# Patient Record
Sex: Female | Born: 1980 | Race: White | Hispanic: No | Marital: Single | Smoking: Never smoker
Health system: Southern US, Community
[De-identification: ages and names within clinical notes are randomized; demographics above are authoritative.]

## PROBLEM LIST (undated history)

## (undated) DIAGNOSIS — Z973 Presence of spectacles and contact lenses: Secondary | ICD-10-CM

## (undated) DIAGNOSIS — G4700 Insomnia, unspecified: Secondary | ICD-10-CM

## (undated) DIAGNOSIS — M1990 Unspecified osteoarthritis, unspecified site: Secondary | ICD-10-CM

## (undated) DIAGNOSIS — E039 Hypothyroidism, unspecified: Secondary | ICD-10-CM

## (undated) DIAGNOSIS — J45990 Exercise induced bronchospasm: Secondary | ICD-10-CM

## (undated) DIAGNOSIS — F319 Bipolar disorder, unspecified: Secondary | ICD-10-CM

## (undated) DIAGNOSIS — K219 Gastro-esophageal reflux disease without esophagitis: Secondary | ICD-10-CM

## (undated) DIAGNOSIS — F603 Borderline personality disorder: Secondary | ICD-10-CM

## (undated) DIAGNOSIS — R0683 Snoring: Secondary | ICD-10-CM

## (undated) DIAGNOSIS — J3089 Other allergic rhinitis: Secondary | ICD-10-CM

## (undated) DIAGNOSIS — I10 Essential (primary) hypertension: Secondary | ICD-10-CM

## (undated) DIAGNOSIS — N200 Calculus of kidney: Secondary | ICD-10-CM

## (undated) DIAGNOSIS — E221 Hyperprolactinemia: Secondary | ICD-10-CM

## (undated) DIAGNOSIS — F32A Depression, unspecified: Secondary | ICD-10-CM

## (undated) DIAGNOSIS — R109 Unspecified abdominal pain: Secondary | ICD-10-CM

## (undated) DIAGNOSIS — K449 Diaphragmatic hernia without obstruction or gangrene: Secondary | ICD-10-CM

## (undated) DIAGNOSIS — Z87442 Personal history of urinary calculi: Secondary | ICD-10-CM

## (undated) DIAGNOSIS — R350 Frequency of micturition: Secondary | ICD-10-CM

## (undated) DIAGNOSIS — F329 Major depressive disorder, single episode, unspecified: Secondary | ICD-10-CM

## (undated) DIAGNOSIS — E785 Hyperlipidemia, unspecified: Secondary | ICD-10-CM

## (undated) DIAGNOSIS — N201 Calculus of ureter: Secondary | ICD-10-CM

## (undated) DIAGNOSIS — R3915 Urgency of urination: Secondary | ICD-10-CM

## (undated) DIAGNOSIS — M797 Fibromyalgia: Secondary | ICD-10-CM

## (undated) DIAGNOSIS — Z8719 Personal history of other diseases of the digestive system: Secondary | ICD-10-CM

## (undated) DIAGNOSIS — G43909 Migraine, unspecified, not intractable, without status migrainosus: Secondary | ICD-10-CM

## (undated) DIAGNOSIS — K589 Irritable bowel syndrome without diarrhea: Secondary | ICD-10-CM

## (undated) DIAGNOSIS — R000 Tachycardia, unspecified: Secondary | ICD-10-CM

---
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
## 1999-05-26 ENCOUNTER — Encounter: Payer: Self-pay | Admitting: Emergency Medicine

## 1999-05-26 ENCOUNTER — Emergency Department (HOSPITAL_COMMUNITY): Admission: EM | Admit: 1999-05-26 | Discharge: 1999-05-26 | Payer: Self-pay | Admitting: Emergency Medicine

## 1999-06-29 ENCOUNTER — Other Ambulatory Visit: Admission: RE | Admit: 1999-06-29 | Discharge: 1999-06-29 | Payer: Self-pay | Admitting: *Deleted

## 2003-01-28 ENCOUNTER — Emergency Department (HOSPITAL_COMMUNITY): Admission: EM | Admit: 2003-01-28 | Discharge: 2003-01-28 | Payer: Self-pay | Admitting: Emergency Medicine

## 2005-10-02 HISTORY — PX: OTHER SURGICAL HISTORY: SHX169

## 2007-10-03 HISTORY — PX: BREAST BIOPSY: SHX20

## 2013-02-02 ENCOUNTER — Emergency Department (HOSPITAL_COMMUNITY)
Admission: EM | Admit: 2013-02-02 | Discharge: 2013-02-03 | Disposition: A | Discharge status: Home / Self Care | Payer: BC Managed Care – PPO | Attending: Emergency Medicine | Admitting: Emergency Medicine

## 2013-02-02 ENCOUNTER — Emergency Department (HOSPITAL_COMMUNITY): Payer: BC Managed Care – PPO

## 2013-02-02 ENCOUNTER — Encounter (HOSPITAL_COMMUNITY): Payer: Self-pay | Admitting: *Deleted

## 2013-02-02 DIAGNOSIS — K8050 Calculus of bile duct without cholangitis or cholecystitis without obstruction: Secondary | ICD-10-CM

## 2013-02-02 DIAGNOSIS — Z87442 Personal history of urinary calculi: Secondary | ICD-10-CM | POA: Insufficient documentation

## 2013-02-02 DIAGNOSIS — F319 Bipolar disorder, unspecified: Secondary | ICD-10-CM | POA: Insufficient documentation

## 2013-02-02 DIAGNOSIS — G4700 Insomnia, unspecified: Secondary | ICD-10-CM | POA: Insufficient documentation

## 2013-02-02 DIAGNOSIS — Z79899 Other long term (current) drug therapy: Secondary | ICD-10-CM | POA: Insufficient documentation

## 2013-02-02 DIAGNOSIS — K219 Gastro-esophageal reflux disease without esophagitis: Secondary | ICD-10-CM | POA: Insufficient documentation

## 2013-02-02 DIAGNOSIS — K8020 Calculus of gallbladder without cholecystitis without obstruction: Secondary | ICD-10-CM | POA: Insufficient documentation

## 2013-02-02 DIAGNOSIS — Z3202 Encounter for pregnancy test, result negative: Secondary | ICD-10-CM | POA: Insufficient documentation

## 2013-02-02 DIAGNOSIS — F603 Borderline personality disorder: Secondary | ICD-10-CM | POA: Insufficient documentation

## 2013-02-02 DIAGNOSIS — IMO0001 Reserved for inherently not codable concepts without codable children: Secondary | ICD-10-CM | POA: Insufficient documentation

## 2013-02-02 DIAGNOSIS — R110 Nausea: Secondary | ICD-10-CM | POA: Insufficient documentation

## 2013-02-02 DIAGNOSIS — I10 Essential (primary) hypertension: Secondary | ICD-10-CM | POA: Insufficient documentation

## 2013-02-02 HISTORY — DX: Insomnia, unspecified: G47.00

## 2013-02-02 HISTORY — DX: Depression, unspecified: F32.A

## 2013-02-02 HISTORY — DX: Gastro-esophageal reflux disease without esophagitis: K21.9

## 2013-02-02 HISTORY — DX: Major depressive disorder, single episode, unspecified: F32.9

## 2013-02-02 HISTORY — DX: Essential (primary) hypertension: I10

## 2013-02-02 HISTORY — DX: Borderline personality disorder: F60.3

## 2013-02-02 HISTORY — DX: Bipolar disorder, unspecified: F31.9

## 2013-02-02 HISTORY — DX: Fibromyalgia: M79.7

## 2013-02-02 LAB — URINALYSIS, MICROSCOPIC ONLY
Bilirubin Urine: NEGATIVE
Glucose, UA: NEGATIVE mg/dL
Hgb urine dipstick: NEGATIVE
Specific Gravity, Urine: 1.031 — ABNORMAL HIGH (ref 1.005–1.030)
Urobilinogen, UA: 1 mg/dL (ref 0.0–1.0)
pH: 6 (ref 5.0–8.0)

## 2013-02-02 LAB — COMPREHENSIVE METABOLIC PANEL
ALT: 28 U/L (ref 0–35)
AST: 23 U/L (ref 0–37)
Albumin: 3.8 g/dL (ref 3.5–5.2)
CO2: 27 mEq/L (ref 19–32)
Calcium: 9.5 mg/dL (ref 8.4–10.5)
Chloride: 104 mEq/L (ref 96–112)
GFR calc non Af Amer: >90 mL/min (ref >90)
Sodium: 138 mEq/L (ref 135–145)

## 2013-02-02 LAB — CBC WITH DIFFERENTIAL/PLATELET
Basophils Absolute: 0 10*3/uL (ref 0.0–0.1)
Basophils Relative: 1 % (ref 0–1)
Eosinophils Relative: 1 % (ref 0–5)
Lymphocytes Relative: 25 % (ref 12–46)
Neutro Abs: 5.5 10*3/uL (ref 1.7–7.7)
Platelets: 375 10*3/uL (ref 150–400)
RDW: 12.1 % (ref 11.5–15.5)
WBC: 8.3 10*3/uL (ref 4.0–10.5)

## 2013-02-02 LAB — LIPASE, BLOOD: Lipase: 31 U/L (ref 11–59)

## 2013-02-02 MED ORDER — ONDANSETRON HCL 4 MG PO TABS
4.0000 mg | ORAL_TABLET | Freq: Once | ORAL | Status: AC
  Administered 2013-02-02: 4 mg via ORAL
  Filled 2013-02-02: qty 1

## 2013-02-02 MED ORDER — HYDROCODONE-ACETAMINOPHEN 5-325 MG PO TABS
1.0000 | ORAL_TABLET | Freq: Once | ORAL | Status: AC
  Administered 2013-02-02: 1 via ORAL
  Filled 2013-02-02: qty 1

## 2013-02-02 NOTE — ED Notes (Signed)
Pt states that she has been having abd pain, gas, constipation and nausea off and on for 6 months; pt also c/o reflux and heartburn; pt states that the symptoms have gotten worse over the last 3 weeks; pt states that she is not vomiting but c/o nausea after eating anything; pt states that the abd pain is mostly epigastric pain but has some lower abd pain off and on.

## 2013-02-02 NOTE — ED Notes (Signed)
U/S at pt bedside 

## 2013-02-02 NOTE — ED Provider Notes (Signed)
History     CSN: [id number]  Arrival date & time 02/02/13  4098   First MD Initiated Contact with Patient 02/02/13 2139      Chief Complaint  Patient presents with  . Abdominal Pain  . Nausea    (Consider location/radiation/quality/duration/timing/severity/associated sxs/prior treatment) HPI   Patient is a 32 yo female past history significant for GERD and fibromyalgia presented to the emergency department for worsening abdominal pain over last 6 months, specifically the last 2 weeks. Pain is located in the epigastric or right upper quadrant sharp constant with associated nausea without vomiting. Patient has also had associated on and off constipation and diarrhea, also states she's noticed in stool color changes such as green stools. Denies any recent antibiotic use, abdominal or pelvic surgeries, travel, or dietary changes.    Past Medical History  Diagnosis Date  . Fibromyalgia   . Bipolar 1 disorder   . Depression   . Insomnia   . Borderline personality disorder   . Hypertension   . GERD (gastroesophageal reflux disease)   . Renal disorder     kidney stones with stent    History reviewed. No pertinent past surgical history.  No family history on file.  History  Substance Use Topics  . Smoking status: Never Smoker   . Smokeless tobacco: Not on file  . Alcohol Use: Yes     Comment: socially    OB History   Grav Para Term Preterm Abortions TAB SAB Ect Mult Living                  Review of Systems  Constitutional: Positive for chills.  Eyes: Negative for visual disturbance.  Respiratory: Negative for cough and shortness of breath.   Cardiovascular: Negative for chest pain.  Gastrointestinal: Positive for nausea, abdominal pain, diarrhea and constipation. Negative for vomiting, blood in stool and anal bleeding.  Genitourinary: Positive for frequency.  Musculoskeletal: Positive for myalgias.       Consistent w/ fibromyalgia pain w/o change  Skin: Negative.    Neurological: Positive for headaches.    Allergies  Morphine and related  Home Medications   Current Outpatient Rx  Name  Route  Sig  Dispense  Refill  . DULoxetine (CYMBALTA) 30 MG capsule   Oral   Take 30 mg by mouth 3 (three) times daily.         Marland Kitchen lamoTRIgine (LAMICTAL) 100 MG tablet   Oral   Take 100 mg by mouth at bedtime.         . lidocaine (LIDODERM) 5 %   Transdermal   Place 1 patch onto the skin daily. Remove & Discard patch within 12 hours or as directed by MD         . Melatonin 10 MG TABS   Oral   Take 1 tablet by mouth at bedtime.         . methocarbamol (ROBAXIN) 500 MG tablet   Oral   Take 500 mg by mouth 3 (three) times daily.         Marland Kitchen omeprazole (PRILOSEC OTC) 20 MG tablet   Oral   Take 20 mg by mouth every evening.         . pregabalin (LYRICA) 100 MG capsule   Oral   Take 100 mg by mouth 3 (three) times daily.         Marland Kitchen zolpidem (AMBIEN) 10 MG tablet   Oral   Take 10 mg by mouth at bedtime.         Marland Kitchen  HYDROcodone-acetaminophen (NORCO/VICODIN) 5-325 MG per tablet   Oral   Take 1 tablet by mouth every 4 (four) hours as needed for pain.   10 tablet   0   . ondansetron (ZOFRAN) 4 MG tablet   Oral   Take 1 tablet (4 mg total) by mouth every 6 (six) hours.   12 tablet   0     BP 140/94  Pulse 98  Temp(Src) 99.1 F (37.3 C) (Oral)  Resp 18  Ht 5\' 6"  (1.676 m)  Wt 260 lb (117.935 kg)  BMI 41.99 kg/m2  SpO2 98%  Physical Exam  Constitutional: She is oriented to person, place, and time. She appears well-developed and well-nourished.  HENT:  Head: Normocephalic and atraumatic.  Mouth/Throat: Oropharynx is clear and moist.  Eyes: EOM are normal. Pupils are equal, round, and reactive to light. No scleral icterus.  Neck: Normal range of motion. Neck supple.  Cardiovascular: Normal rate, regular rhythm and normal heart sounds.   Pulmonary/Chest: Effort normal and breath sounds normal.  Abdominal: Soft. Bowel sounds are  normal. She exhibits no distension and no mass. There is tenderness. There is positive Murphy's sign. There is no rebound, no guarding and no CVA tenderness.    Obese abdomen.   Neurological: She is alert and oriented to person, place, and time.  Skin: Skin is warm and dry.  Psychiatric: She has a normal mood and affect.    ED Course  Procedures (including critical care time)  Labs Reviewed  COMPREHENSIVE METABOLIC PANEL - Abnormal; Notable for the following:    Total Bilirubin 0.2 (*)    All other components within normal limits  URINALYSIS, MICROSCOPIC ONLY - Abnormal; Notable for the following:    APPearance CLOUDY (*)    Specific Gravity, Urine 1.031 (*)    Leukocytes, UA SMALL (*)    All other components within normal limits  CBC WITH DIFFERENTIAL  LIPASE, BLOOD  POCT PREGNANCY, URINE   US Abdomen Complete  02/03/2013  *RADIOLOGY REPORT*  Clinical Data:  Right upper quadrant pain.  COMPLETE ABDOMINAL ULTRASOUND  Comparison:  None.  Findings:  Gallbladder:  Small stones within the fundus of the gallbladder, the largest measuring 8 mm.  No wall thickening.  Negative sonographic Murphy's.  Common bile duct:   Normal caliber, 4 mm.  Liver:  No focal lesion identified.  Within normal limits in parenchymal echogenicity.  IVC:  Appears normal.  Pancreas:  No focal abnormality seen.  Spleen:  Within normal limits in size and echotexture.  Right Kidney:   Normal in size and parenchymal echogenicity.  No evidence of mass or hydronephrosis.  Left Kidney:  Normal in size and parenchymal echogenicity.  No evidence of mass or hydronephrosis.  Abdominal aorta:  No aneurysm identified.  IMPRESSION: Cholelithiasis.  No sonographic evidence of acute cholecystitis.   Original Report Authenticated By: Charlett Nose, M.D.      1. Biliary colic       MDM  Patient is nontoxic, nonseptic appearing, in no apparent distress.  Patient's pain and other symptoms adequately managed in emergency department.   Labs, imaging and vitals reviewed.  Patient does not meet the SIRS or Sepsis criteria.  On repeat exam patient does not have a surgical abdomen and there are nor peritoneal signs.  No indication of appendicitis, bowel obstruction, bowel perforation, cholecystitis, diverticulitis, PID or ectopic pregnancy.  Pt noted to have cholelithiasis w/o evidence of acute cholecystitis. Advised to follow up with general surgeon. Also advised to follow  up with PCP regarding elevated BP. Patient discharged home with symptomatic treatment and given strict instructions for follow-up with their primary care physician and [facility] to discuss options for her gallstone.  I have also discussed reasons to return immediately to the ER.  Patient expresses understanding and agrees with plan. Patient d/w with Dr. Ethelda Chick, agrees with plan. Patient is stable at time of discharge             Jeannetta Ellis, PA-C 02/04/13 1319

## 2013-02-03 ENCOUNTER — Telehealth (HOSPITAL_COMMUNITY): Payer: Self-pay | Admitting: Emergency Medicine

## 2013-02-03 MED ORDER — HYDROCODONE-ACETAMINOPHEN 5-325 MG PO TABS: 1.0000 | ORAL_TABLET | ORAL | Status: DC | PRN

## 2013-02-03 MED ORDER — ONDANSETRON HCL 4 MG PO TABS: 4.0000 mg | ORAL_TABLET | Freq: Four times a day (QID) | ORAL | Status: DC

## 2013-02-03 NOTE — ED Notes (Signed)
Elpidio Anis reviewed chart and okayed the use of Norco. Melvenia Beam called the pharm back at [phone].

## 2013-02-03 NOTE — ED Provider Notes (Signed)
Compressive abdominal pain for 6 months, worse with eating. Worse over the past 3 weeks symptoms accompanied by nausea. Bowel movements have been intermittently diarrhea and constipated. Pain is mild at present. No fever. No other associated symptoms No treatment prior to coming here. On exam nontoxic alert lungs clear auscultation heart regular rate and rhythm abdomen morbidly obese normal active bowel sounds tender at right upper quadrant sign no guarding or rigidity. Suspect biliary colic. No evidence of cholecystitis  Doug Sou, MD 02/03/13 [phone]

## 2013-02-03 NOTE — ED Notes (Signed)
Pharmacy wants to speak with MD about Norco prescription, since pt has allergy to Morphine.  Chart sent to EDP for review.

## 2013-02-04 ENCOUNTER — Encounter (INDEPENDENT_AMBULATORY_CARE_PROVIDER_SITE_OTHER): Payer: Self-pay | Admitting: General Surgery

## 2013-02-04 ENCOUNTER — Ambulatory Visit (INDEPENDENT_AMBULATORY_CARE_PROVIDER_SITE_OTHER): Payer: BC Managed Care – PPO | Admitting: General Surgery

## 2013-02-04 VITALS — BP 130/80 | HR 90 | Temp 97.9°F | Ht 66.0 in | Wt 253.0 lb

## 2013-02-04 DIAGNOSIS — K8066 Calculus of gallbladder and bile duct with acute and chronic cholecystitis without obstruction: Secondary | ICD-10-CM

## 2013-02-04 DIAGNOSIS — K8010 Calculus of gallbladder with chronic cholecystitis without obstruction: Secondary | ICD-10-CM

## 2013-02-04 NOTE — Patient Instructions (Signed)

## 2013-02-05 NOTE — Progress Notes (Signed)
Chief Complaint  Patient presents with  . New Evaluation    eval gallstones    HISTORY: Pt is a 32 yo F who presents with around 6 months of chronic nausea.  She then started to develop attacks of pain over the last 2-3 weeks in the epigastric region with food.  She has tried tapering back her intake of fatty or greasy foods.  She describes the pain as varying between a 3/10 and 7/10.  The attacks of pain usually last around 2 hours.  She denies fevers or chills.  She has never been jaundiced.  She denies acholic stools or dark urine.  She does have alternating constipation and diarrhea.    Past Medical History  Diagnosis Date  . Fibromyalgia   . Bipolar 1 disorder   . Depression   . Insomnia   . Borderline personality disorder   . Hypertension   . GERD (gastroesophageal reflux disease)   . Renal disorder     kidney stones with stent    Past Surgical History  Procedure Laterality Date  . Kidney stent  2007    placement and removal  . Foot surgery Left 1999    nodule removed    Current Outpatient Prescriptions  Medication Sig Dispense Refill  . DULoxetine (CYMBALTA) 30 MG capsule Take 30 mg by mouth 3 (three) times daily.      Marland Kitchen HYDROcodone-acetaminophen (NORCO/VICODIN) 5-325 MG per tablet Take 1 tablet by mouth every 4 (four) hours as needed for pain.  10 tablet  0  . lamoTRIgine (LAMICTAL) 100 MG tablet Take 100 mg by mouth at bedtime.      . lidocaine (LIDODERM) 5 % Place 1 patch onto the skin daily. Remove & Discard patch within 12 hours or as directed by MD      . Melatonin 10 MG TABS Take 1 tablet by mouth at bedtime.      . methocarbamol (ROBAXIN) 500 MG tablet Take 500 mg by mouth 3 (three) times daily.      Marland Kitchen omeprazole (PRILOSEC OTC) 20 MG tablet Take 20 mg by mouth every evening.      . ondansetron (ZOFRAN) 4 MG tablet Take 1 tablet (4 mg total) by mouth every 6 (six) hours.  12 tablet  0  . pregabalin (LYRICA) 100 MG capsule Take 100 mg by mouth 3 (three) times  daily.      Marland Kitchen zolpidem (AMBIEN) 10 MG tablet Take 10 mg by mouth at bedtime.       No current facility-administered medications for this visit.     Allergies  Allergen Reactions  . Morphine And Related Other (See Comments)    Severe migraines     Family History  Problem Relation Age of Onset  . Hypertension Mother   . Bipolar disorder Mother   . Hypertension Father   . Depression Brother   . Alcohol abuse Brother   . Bipolar disorder Brother      History   Social History  . Marital Status: Single    Spouse Name: N/A    Number of Children: N/A  . Years of Education: N/A   Social History Main Topics  . Smoking status: Never Smoker   . Smokeless tobacco: None  . Alcohol Use: Yes     Comment: socially  . Drug Use: No  . Sexually Active: Yes    Birth Control/ Protection: Implant   Other Topics Concern  . None   Social History Narrative  . None  REVIEW OF SYSTEMS - PERTINENT POSITIVES ONLY: 12 point review of systems negative other than HPI and PMH except for joint and muscle pain.  EXAM: Filed Vitals:   02/04/13 1609  BP: 130/80  Pulse: 90  Temp: 97.9 F (36.6 C)   Filed Weights   02/04/13 1609  Weight: 253 lb (114.76 kg)     Gen:  No acute distress.  Well nourished and well groomed.   Neurological: Alert and oriented to person, place, and time. Coordination normal.  Head: Normocephalic and atraumatic.  Eyes: Conjunctivae are normal. Pupils are equal, round, and reactive to light. No scleral icterus.  Neck: Normal range of motion. Neck supple. No tracheal deviation or thyromegaly present.  Cardiovascular: Normal rate, regular rhythm, normal heart sounds and intact distal pulses.  Exam reveals no gallop and no friction rub.  No murmur heard. Respiratory: Effort normal.  No respiratory distress. No chest wall tenderness. Breath sounds normal.  No wheezes, rales or rhonchi.  GI: Soft. Bowel sounds are normal. The abdomen is soft and nondistended.   There is mild RUQ tenderness.  There is no rebound and no guarding.  Musculoskeletal: Normal range of motion. Extremities are nontender.  Lymphadenopathy: No cervical, preauricular, postauricular or axillary adenopathy is present Skin: Skin is warm and dry. No rash noted. No diaphoresis. No erythema. No pallor. No clubbing, cyanosis, or edema.   Psychiatric: Normal mood and affect. Behavior is normal. Judgment and thought content normal.    LABORATORY RESULTS: Available labs are reviewed  LFTs, CBC OK  RADIOLOGY RESULTS: See E-Chart or I-Site for most recent results.  Images and reports are reviewed. RUQ u/s IMPRESSION:  Cholelithiasis. No sonographic evidence of acute cholecystitis.    ASSESSMENT AND PLAN: Cholelithiasis with acute or chronic cholecystitis Pt with classic biliary colic and chronic ruq pain and nausea.  Will plan laparoscopic cholecystectomy with cholangiogram.    The surgical procedure was described to the patient in detail.  The patient was given Agricultural engineer. .  I discussed the incision type and location, the location of the gallbladder, the anatomy of the bile ducts and arteries, and the typical progression of surgery.  I discussed the possibility of converting to an open operation.  I advised of the risks of bleeding, infection, damage to other structures (such as the bile duct, intestine or liver), bile leak, need for other procedures or surgeries, and post op diarrhea/constipation.  We discussed the risk of blood clot.  We discussed the recovery period and post operative restrictions.  The patient was advised against taking blood thinners the week before surgery.          Maudry Diego MD Surgical Oncology, General and Endocrine Surgery [facility]      Visit Diagnoses: 1. Cholelithiasis with acute or chronic cholecystitis     Primary Care Physician: Susy Frizzle, MD

## 2013-02-05 NOTE — Assessment & Plan Note (Addendum)
Pt with classic biliary colic and chronic ruq pain and nausea.  Will plan laparoscopic cholecystectomy with cholangiogram.    The surgical procedure was described to the patient in detail.  The patient was given Agricultural engineer. .  I discussed the incision type and location, the location of the gallbladder, the anatomy of the bile ducts and arteries, and the typical progression of surgery.  I discussed the possibility of converting to an open operation.  I advised of the risks of bleeding, infection, damage to other structures (such as the bile duct, intestine or liver), bile leak, need for other procedures or surgeries, and post op diarrhea/constipation.  We discussed the risk of blood clot.  We discussed the recovery period and post operative restrictions.  The patient was advised against taking blood thinners the week before surgery.

## 2013-02-06 ENCOUNTER — Other Ambulatory Visit (INDEPENDENT_AMBULATORY_CARE_PROVIDER_SITE_OTHER): Payer: Self-pay | Admitting: General Surgery

## 2013-02-06 DIAGNOSIS — K8010 Calculus of gallbladder with chronic cholecystitis without obstruction: Secondary | ICD-10-CM

## 2013-02-06 DIAGNOSIS — K824 Cholesterolosis of gallbladder: Secondary | ICD-10-CM

## 2013-02-06 HISTORY — PX: LAPAROSCOPIC CHOLECYSTECTOMY: SUR755

## 2013-02-06 NOTE — ED Provider Notes (Signed)
Medical screening examination/treatment/procedure(s) were conducted as a shared visit with non-physician practitioner(s) and myself.  I personally evaluated the patient during the encounter  Doug Sou, MD 02/06/13 1615

## 2013-02-11 ENCOUNTER — Other Ambulatory Visit (INDEPENDENT_AMBULATORY_CARE_PROVIDER_SITE_OTHER): Payer: Self-pay

## 2013-02-11 ENCOUNTER — Telehealth (INDEPENDENT_AMBULATORY_CARE_PROVIDER_SITE_OTHER): Payer: Self-pay | Admitting: *Deleted

## 2013-02-11 ENCOUNTER — Ambulatory Visit (INDEPENDENT_AMBULATORY_CARE_PROVIDER_SITE_OTHER): Payer: BC Managed Care – PPO | Admitting: Surgery

## 2013-02-11 NOTE — Telephone Encounter (Signed)
Patient called to ask for more nausea medication.  Patient states following eating she often times becomes nauseated and has to take a "pill".

## 2013-02-17 ENCOUNTER — Telehealth (INDEPENDENT_AMBULATORY_CARE_PROVIDER_SITE_OTHER): Payer: Self-pay

## 2013-02-17 NOTE — Telephone Encounter (Signed)
Pt's appt on 5/29 cancelled due to office cx.  She is welcome to come in today for a 3:00pm if she calls on time, or 02/28/13 at 8:45am.

## 2013-02-20 ENCOUNTER — Telehealth (INDEPENDENT_AMBULATORY_CARE_PROVIDER_SITE_OTHER): Payer: Self-pay | Admitting: General Surgery

## 2013-02-20 NOTE — Telephone Encounter (Signed)
Pt called to report she is just over 2 weeks post op chole and is still experiencing frequent nausea and some sharp pains near her incision sites.  She has been taking Phenergan for the nausea.  Counseled her to try eating small amounts frequently, especially starches for ease of digestion and help to deter the nausea.  Also, to begin using NSAIDs to augment her pain medication.  Will call in Hydrocodone 5/325 mg, # 30, 1-2 po Q4-6H prn pain, no refill to [facility] [address]:  [phone].

## 2013-02-27 ENCOUNTER — Encounter (INDEPENDENT_AMBULATORY_CARE_PROVIDER_SITE_OTHER): Payer: BC Managed Care – PPO | Admitting: General Surgery

## 2013-02-28 ENCOUNTER — Encounter (INDEPENDENT_AMBULATORY_CARE_PROVIDER_SITE_OTHER): Payer: BC Managed Care – PPO | Admitting: General Surgery

## 2013-02-28 ENCOUNTER — Telehealth (INDEPENDENT_AMBULATORY_CARE_PROVIDER_SITE_OTHER): Payer: Self-pay

## 2013-02-28 NOTE — Telephone Encounter (Signed)
Pt walked into the office today.  She did not realize her appt had been changed.  She is still having a lot of pain at incision sites as well as severe heartburn with nausea.  Dr. Andrey Campanile agreed to Zofran 4mg  #30 and Norco 5/325 #30.  I told the pt I would talk to Dr. Donell Beers on Monday about her symptoms, and that she may need a referral to GI for her reflux.  Pt understood and agreed with the plan.

## 2013-03-11 ENCOUNTER — Encounter (INDEPENDENT_AMBULATORY_CARE_PROVIDER_SITE_OTHER): Payer: Self-pay | Admitting: General Surgery

## 2013-03-11 ENCOUNTER — Other Ambulatory Visit (INDEPENDENT_AMBULATORY_CARE_PROVIDER_SITE_OTHER): Payer: Self-pay | Admitting: General Surgery

## 2013-03-11 ENCOUNTER — Ambulatory Visit (INDEPENDENT_AMBULATORY_CARE_PROVIDER_SITE_OTHER): Payer: BC Managed Care – PPO | Admitting: General Surgery

## 2013-03-11 VITALS — BP 126/72 | HR 106 | Temp 96.6°F | Ht 66.0 in | Wt 255.2 lb

## 2013-03-11 DIAGNOSIS — K8010 Calculus of gallbladder with chronic cholecystitis without obstruction: Secondary | ICD-10-CM

## 2013-03-11 DIAGNOSIS — K810 Acute cholecystitis: Secondary | ICD-10-CM

## 2013-03-11 DIAGNOSIS — K8066 Calculus of gallbladder and bile duct with acute and chronic cholecystitis without obstruction: Secondary | ICD-10-CM

## 2013-03-11 NOTE — Progress Notes (Signed)
HISTORY:  Pt is 4 weeks s/p cholecystectomy.  She has had a very slow recovery progress.  She still had some nausea up until last week.  She continues to have soreness in her epigastrium and RUQ.  Both are near incisions, but not quite at the incision site.  She is still taking occasional pain pills.  She denies fevers /chills/ jaundice.       EXAM: General:  Alert and oriented Incision:  Healing well.     PATHOLOGY: Chronic cholecystitis   ASSESSMENT AND PLAN:   Cholelithiasis with acute or chronic cholecystitis Pt still having some pain.  Will get labs to check for infection or elevated LFTs.    Follow up in 4 weeks if no improvement in symptoms.  Will get CT if labs abnormal.        Maudry Diego, MD Surgical Oncology, General & Endocrine Surgery [facility]  Susy Frizzle, MD Deveshwar, Darol Destine, MD

## 2013-03-11 NOTE — Patient Instructions (Addendum)
Get labs drawn.  Follow up in 4 weeks if no improvement

## 2013-03-11 NOTE — Assessment & Plan Note (Addendum)
Pt still having some pain.  Will get labs to check for infection or elevated LFTs.    Follow up in 4 weeks if no improvement in symptoms.  Will get CT if labs abnormal.

## 2013-04-21 ENCOUNTER — Encounter (INDEPENDENT_AMBULATORY_CARE_PROVIDER_SITE_OTHER): Payer: BC Managed Care – PPO | Admitting: General Surgery

## 2013-05-29 ENCOUNTER — Other Ambulatory Visit: Payer: Self-pay | Admitting: Neurosurgery

## 2013-05-30 ENCOUNTER — Encounter (HOSPITAL_COMMUNITY): Payer: Self-pay | Admitting: Pharmacy Technician

## 2013-05-30 ENCOUNTER — Encounter (HOSPITAL_COMMUNITY): Payer: Self-pay | Admitting: *Deleted

## 2013-06-02 MED ORDER — CEFAZOLIN SODIUM-DEXTROSE 2-3 GM-% IV SOLR
2.0000 g | INTRAVENOUS | Status: AC
  Administered 2013-06-03: 2 g via INTRAVENOUS
  Filled 2013-06-02: qty 50

## 2013-06-03 ENCOUNTER — Encounter (HOSPITAL_COMMUNITY): Payer: Self-pay | Admitting: Anesthesiology

## 2013-06-03 ENCOUNTER — Observation Stay (HOSPITAL_COMMUNITY): Payer: BC Managed Care – PPO

## 2013-06-03 ENCOUNTER — Ambulatory Visit (HOSPITAL_COMMUNITY): Payer: BC Managed Care – PPO | Admitting: Anesthesiology

## 2013-06-03 ENCOUNTER — Ambulatory Visit (HOSPITAL_COMMUNITY): Payer: BC Managed Care – PPO

## 2013-06-03 ENCOUNTER — Encounter (HOSPITAL_COMMUNITY)
Admission: RE | Disposition: A | Discharge status: Home / Self Care | Payer: Self-pay | Source: Ambulatory Visit | Attending: Neurosurgery

## 2013-06-03 ENCOUNTER — Observation Stay (HOSPITAL_COMMUNITY)
Admission: RE | Admit: 2013-06-03 | Discharge: 2013-06-04 | Disposition: A | Discharge status: Home / Self Care | Payer: BC Managed Care – PPO | Source: Ambulatory Visit | Attending: Neurosurgery | Admitting: Neurosurgery

## 2013-06-03 ENCOUNTER — Encounter (HOSPITAL_COMMUNITY): Payer: Self-pay | Admitting: *Deleted

## 2013-06-03 DIAGNOSIS — N289 Disorder of kidney and ureter, unspecified: Secondary | ICD-10-CM | POA: Insufficient documentation

## 2013-06-03 DIAGNOSIS — I10 Essential (primary) hypertension: Secondary | ICD-10-CM | POA: Insufficient documentation

## 2013-06-03 DIAGNOSIS — F319 Bipolar disorder, unspecified: Secondary | ICD-10-CM | POA: Insufficient documentation

## 2013-06-03 DIAGNOSIS — Z87442 Personal history of urinary calculi: Secondary | ICD-10-CM | POA: Insufficient documentation

## 2013-06-03 DIAGNOSIS — Z01818 Encounter for other preprocedural examination: Secondary | ICD-10-CM | POA: Insufficient documentation

## 2013-06-03 DIAGNOSIS — M5126 Other intervertebral disc displacement, lumbar region: Principal | ICD-10-CM | POA: Insufficient documentation

## 2013-06-03 DIAGNOSIS — K219 Gastro-esophageal reflux disease without esophagitis: Secondary | ICD-10-CM | POA: Insufficient documentation

## 2013-06-03 DIAGNOSIS — K449 Diaphragmatic hernia without obstruction or gangrene: Secondary | ICD-10-CM | POA: Insufficient documentation

## 2013-06-03 DIAGNOSIS — J45909 Unspecified asthma, uncomplicated: Secondary | ICD-10-CM | POA: Insufficient documentation

## 2013-06-03 DIAGNOSIS — M79609 Pain in unspecified limb: Secondary | ICD-10-CM | POA: Insufficient documentation

## 2013-06-03 HISTORY — PX: LUMBAR LAMINECTOMY/DECOMPRESSION MICRODISCECTOMY: SHX5026

## 2013-06-03 HISTORY — DX: Unspecified osteoarthritis, unspecified site: M19.90

## 2013-06-03 LAB — BASIC METABOLIC PANEL
BUN: 13 mg/dL (ref 6–23)
Calcium: 9.3 mg/dL (ref 8.4–10.5)
Creatinine, Ser: 0.78 mg/dL (ref 0.50–1.10)
GFR calc Af Amer: >90 mL/min (ref >90)
GFR calc non Af Amer: >90 mL/min (ref >90)
Potassium: 3.8 mEq/L (ref 3.5–5.1)

## 2013-06-03 LAB — CBC
MCH: 31.2 pg (ref 26.0–34.0)
MCHC: 35.2 g/dL (ref 30.0–36.0)
Platelets: 397 10*3/uL (ref 150–400)
RDW: 12.3 % (ref 11.5–15.5)

## 2013-06-03 LAB — SURGICAL PCR SCREEN
MRSA, PCR: NEGATIVE
Staphylococcus aureus: POSITIVE — AB

## 2013-06-03 SURGERY — LUMBAR LAMINECTOMY/DECOMPRESSION MICRODISCECTOMY 1 LEVEL
Anesthesia: General | Site: Back | Laterality: Left | Wound class: Clean

## 2013-06-03 MED ORDER — LACTATED RINGERS IV SOLN
INTRAVENOUS | Status: DC
  Administered 2013-06-03: 12:00:00 via INTRAVENOUS

## 2013-06-03 MED ORDER — OXYCODONE-ACETAMINOPHEN 5-325 MG PO TABS
1.0000 | ORAL_TABLET | ORAL | Status: DC | PRN
  Administered 2013-06-03 – 2013-06-04 (×2): 2 via ORAL
  Filled 2013-06-03 (×2): qty 2

## 2013-06-03 MED ORDER — MUPIROCIN 2 % EX OINT
TOPICAL_OINTMENT | Freq: Two times a day (BID) | CUTANEOUS | Status: DC
  Administered 2013-06-03: 21:00:00 via NASAL

## 2013-06-03 MED ORDER — MIDAZOLAM HCL 5 MG/5ML IJ SOLN
INTRAMUSCULAR | Status: DC | PRN
  Administered 2013-06-03: 2 mg via INTRAVENOUS

## 2013-06-03 MED ORDER — OXYCODONE HCL 5 MG PO TABS
ORAL_TABLET | ORAL | Status: AC
  Administered 2013-06-03: 5 mg via ORAL
  Filled 2013-06-03: qty 1

## 2013-06-03 MED ORDER — SODIUM CHLORIDE 0.9 % IJ SOLN: 3.0000 mL | INTRAMUSCULAR | Status: DC | PRN

## 2013-06-03 MED ORDER — FENTANYL CITRATE 0.05 MG/ML IJ SOLN
INTRAMUSCULAR | Status: DC | PRN
  Administered 2013-06-03: 50 ug via INTRAVENOUS
  Administered 2013-06-03: 150 ug via INTRAVENOUS
  Administered 2013-06-03 (×2): 50 ug via INTRAVENOUS
  Administered 2013-06-03: 100 ug via INTRAVENOUS

## 2013-06-03 MED ORDER — FENTANYL CITRATE 0.05 MG/ML IJ SOLN
INTRAMUSCULAR | Status: AC
  Administered 2013-06-03: 25 ug via INTRAVENOUS
  Filled 2013-06-03: qty 2

## 2013-06-03 MED ORDER — SODIUM CHLORIDE 0.9 % IR SOLN
Status: DC | PRN
  Administered 2013-06-03: 17:00:00

## 2013-06-03 MED ORDER — HYDROMORPHONE HCL PF 1 MG/ML IJ SOLN
INTRAMUSCULAR | Status: AC
  Administered 2013-06-03: 0.5 mg via INTRAVENOUS
  Filled 2013-06-03: qty 1

## 2013-06-03 MED ORDER — CEFAZOLIN SODIUM 1-5 GM-% IV SOLN
1.0000 g | Freq: Three times a day (TID) | INTRAVENOUS | Status: AC
  Administered 2013-06-03 – 2013-06-04 (×2): 1 g via INTRAVENOUS
  Filled 2013-06-03 (×2): qty 50

## 2013-06-03 MED ORDER — METHYLPREDNISOLONE ACETATE 80 MG/ML IJ SUSP
INTRAMUSCULAR | Status: DC | PRN
  Administered 2013-06-03: 80 mg

## 2013-06-03 MED ORDER — THROMBIN 5000 UNITS EX SOLR
CUTANEOUS | Status: DC | PRN
  Administered 2013-06-03 (×2): 5000 [IU] via TOPICAL

## 2013-06-03 MED ORDER — ONDANSETRON HCL 4 MG/2ML IJ SOLN
INTRAMUSCULAR | Status: AC
  Administered 2013-06-03: 4 mg
  Filled 2013-06-03: qty 2

## 2013-06-03 MED ORDER — LIDOCAINE HCL (CARDIAC) 20 MG/ML IV SOLN
INTRAVENOUS | Status: DC | PRN
  Administered 2013-06-03: 100 mg via INTRAVENOUS

## 2013-06-03 MED ORDER — DIAZEPAM 5 MG PO TABS
ORAL_TABLET | ORAL | Status: AC
  Administered 2013-06-03: 5 mg via ORAL
  Filled 2013-06-03: qty 1

## 2013-06-03 MED ORDER — THROMBIN 5000 UNITS EX SOLR
OROMUCOSAL | Status: DC | PRN
  Administered 2013-06-03: 18:00:00 via TOPICAL

## 2013-06-03 MED ORDER — SODIUM CHLORIDE 0.9 % IV SOLN: INTRAVENOUS | Status: DC

## 2013-06-03 MED ORDER — MIDAZOLAM HCL 2 MG/2ML IJ SOLN
2.0000 mg | Freq: Once | INTRAMUSCULAR | Status: DC
Start: 2013-06-03 — End: 2013-06-03

## 2013-06-03 MED ORDER — SENNA 8.6 MG PO TABS
1.0000 | ORAL_TABLET | Freq: Two times a day (BID) | ORAL | Status: DC
  Administered 2013-06-03: 8.6 mg via ORAL
  Filled 2013-06-03 (×3): qty 1

## 2013-06-03 MED ORDER — DOCUSATE SODIUM 100 MG PO CAPS
100.0000 mg | ORAL_CAPSULE | Freq: Two times a day (BID) | ORAL | Status: DC
  Administered 2013-06-03: 100 mg via ORAL
  Filled 2013-06-03: qty 1

## 2013-06-03 MED ORDER — HYDROMORPHONE HCL PF 1 MG/ML IJ SOLN
1.0000 mg | INTRAMUSCULAR | Status: DC | PRN
  Administered 2013-06-03 – 2013-06-04 (×2): 1 mg via INTRAVENOUS
  Filled 2013-06-03 (×2): qty 1

## 2013-06-03 MED ORDER — OXYCODONE HCL 5 MG PO TABS
5.0000 mg | ORAL_TABLET | Freq: Once | ORAL | Status: AC | PRN
  Administered 2013-06-03: 5 mg via ORAL

## 2013-06-03 MED ORDER — PROMETHAZINE HCL 25 MG/ML IJ SOLN
12.5000 mg | Freq: Once | INTRAMUSCULAR | Status: AC
  Administered 2013-06-03: 12.5 mg via INTRAMUSCULAR
  Filled 2013-06-03: qty 1

## 2013-06-03 MED ORDER — OXYCODONE HCL 5 MG/5ML PO SOLN
5.0000 mg | Freq: Once | ORAL | Status: AC | PRN
Start: 2013-06-03 — End: 2013-06-03

## 2013-06-03 MED ORDER — ACETAMINOPHEN 325 MG PO TABS: 650.0000 mg | ORAL_TABLET | ORAL | Status: DC | PRN

## 2013-06-03 MED ORDER — PROPOFOL 10 MG/ML IV BOLUS
INTRAVENOUS | Status: DC | PRN
  Administered 2013-06-03: 200 mg via INTRAVENOUS
  Administered 2013-06-03: 100 mg via INTRAVENOUS
  Administered 2013-06-03: 50 mg via INTRAVENOUS

## 2013-06-03 MED ORDER — METOCLOPRAMIDE HCL 5 MG/ML IJ SOLN: 10.0000 mg | Freq: Once | INTRAMUSCULAR | Status: DC | PRN

## 2013-06-03 MED ORDER — HEPARIN SODIUM (PORCINE) 5000 UNIT/ML IJ SOLN
5000.0000 [IU] | Freq: Three times a day (TID) | INTRAMUSCULAR | Status: DC
  Administered 2013-06-03 – 2013-06-04 (×2): 5000 [IU] via SUBCUTANEOUS
  Filled 2013-06-03 (×5): qty 1

## 2013-06-03 MED ORDER — MUPIROCIN 2 % EX OINT
TOPICAL_OINTMENT | CUTANEOUS | Status: AC
  Administered 2013-06-03: 12:00:00
  Filled 2013-06-03: qty 22

## 2013-06-03 MED ORDER — DIAZEPAM 5 MG PO TABS
5.0000 mg | ORAL_TABLET | Freq: Four times a day (QID) | ORAL | Status: DC | PRN
  Administered 2013-06-03 – 2013-06-04 (×2): 5 mg via ORAL
  Filled 2013-06-03: qty 1

## 2013-06-03 MED ORDER — ROCURONIUM BROMIDE 100 MG/10ML IV SOLN
INTRAVENOUS | Status: DC | PRN
  Administered 2013-06-03: 50 mg via INTRAVENOUS
  Administered 2013-06-03: 10 mg via INTRAVENOUS

## 2013-06-03 MED ORDER — MIDAZOLAM HCL 2 MG/2ML IJ SOLN
INTRAMUSCULAR | Status: AC
  Administered 2013-06-03: 1 mg
  Filled 2013-06-03: qty 2

## 2013-06-03 MED ORDER — HYDROCODONE-ACETAMINOPHEN 5-325 MG PO TABS: 1.0000 | ORAL_TABLET | ORAL | Status: DC | PRN

## 2013-06-03 MED ORDER — LACTATED RINGERS IV SOLN
INTRAVENOUS | Status: DC | PRN
  Administered 2013-06-03 (×2): via INTRAVENOUS

## 2013-06-03 MED ORDER — HYDROMORPHONE HCL PF 1 MG/ML IJ SOLN
0.2500 mg | INTRAMUSCULAR | Status: DC | PRN
  Administered 2013-06-03: 0.25 mg via INTRAVENOUS
  Administered 2013-06-03 (×3): 0.5 mg via INTRAVENOUS
  Administered 2013-06-03: 0.25 mg via INTRAVENOUS

## 2013-06-03 MED ORDER — ACETAMINOPHEN 650 MG RE SUPP: 650.0000 mg | RECTAL | Status: DC | PRN

## 2013-06-03 MED ORDER — GLYCOPYRROLATE 0.2 MG/ML IJ SOLN
INTRAMUSCULAR | Status: DC | PRN
  Administered 2013-06-03: .6 mg via INTRAVENOUS

## 2013-06-03 MED ORDER — ONDANSETRON HCL 4 MG/2ML IJ SOLN
4.0000 mg | Freq: Once | INTRAMUSCULAR | Status: DC
  Filled 2013-06-03: qty 2

## 2013-06-03 MED ORDER — ARTIFICIAL TEARS OP OINT
TOPICAL_OINTMENT | OPHTHALMIC | Status: DC | PRN
  Administered 2013-06-03: 1 via OPHTHALMIC

## 2013-06-03 MED ORDER — FENTANYL CITRATE 0.05 MG/ML IJ SOLN
50.0000 ug | Freq: Once | INTRAMUSCULAR | Status: AC
  Administered 2013-06-03 (×2): 25 ug via INTRAVENOUS

## 2013-06-03 MED ORDER — LIDOCAINE-EPINEPHRINE 1 %-1:100000 IJ SOLN
INTRAMUSCULAR | Status: DC | PRN
  Administered 2013-06-03: 10 mL

## 2013-06-03 MED ORDER — ONDANSETRON HCL 4 MG/2ML IJ SOLN: 4.0000 mg | INTRAMUSCULAR | Status: DC | PRN

## 2013-06-03 MED ORDER — SODIUM CHLORIDE 0.9 % IV SOLN
1000.0000 mg | INTRAVENOUS | Status: AC
  Filled 2013-06-03 (×4): qty 10

## 2013-06-03 MED ORDER — SODIUM CHLORIDE 0.9 % IV SOLN: 250.0000 mL | INTRAVENOUS | Status: DC

## 2013-06-03 MED ORDER — BUPIVACAINE HCL (PF) 0.5 % IJ SOLN
INTRAMUSCULAR | Status: DC | PRN
  Administered 2013-06-03: 10 mL

## 2013-06-03 MED ORDER — LABETALOL HCL 5 MG/ML IV SOLN
INTRAVENOUS | Status: DC | PRN
  Administered 2013-06-03: 10 mg via INTRAVENOUS
  Administered 2013-06-03 (×4): 5 mg via INTRAVENOUS

## 2013-06-03 MED ORDER — HEMOSTATIC AGENTS (NO CHARGE) OPTIME
TOPICAL | Status: DC | PRN
  Administered 2013-06-03: 1 via TOPICAL

## 2013-06-03 MED ORDER — PHENOL 1.4 % MT LIQD: 1.0000 | OROMUCOSAL | Status: DC | PRN

## 2013-06-03 MED ORDER — ONDANSETRON HCL 4 MG/2ML IJ SOLN
INTRAMUSCULAR | Status: DC | PRN
  Administered 2013-06-03: 4 mg via INTRAVENOUS

## 2013-06-03 MED ORDER — DEXAMETHASONE SODIUM PHOSPHATE 10 MG/ML IJ SOLN
INTRAMUSCULAR | Status: DC | PRN
  Administered 2013-06-03: 10 mg via INTRAVENOUS

## 2013-06-03 MED ORDER — MENTHOL 3 MG MT LOZG: 1.0000 | LOZENGE | OROMUCOSAL | Status: DC | PRN

## 2013-06-03 MED ORDER — SODIUM CHLORIDE 0.9 % IJ SOLN
3.0000 mL | Freq: Two times a day (BID) | INTRAMUSCULAR | Status: DC
  Administered 2013-06-03: 3 mL via INTRAVENOUS

## 2013-06-03 MED ORDER — NEOSTIGMINE METHYLSULFATE 1 MG/ML IJ SOLN
INTRAMUSCULAR | Status: DC | PRN
  Administered 2013-06-03: 4 mg via INTRAVENOUS

## 2013-06-03 SURGICAL SUPPLY — 60 items
ADH SKN CLS LQ APL DERMABOND (GAUZE/BANDAGES/DRESSINGS) ×1
APL SKNCLS STERI-STRIP NONHPOA (GAUZE/BANDAGES/DRESSINGS)
BAG DECANTER FOR FLEXI CONT (MISCELLANEOUS) ×2 IMPLANT
BENZOIN TINCTURE PRP APPL 2/3 (GAUZE/BANDAGES/DRESSINGS) IMPLANT
BLADE SURG ROTATE 9660 (MISCELLANEOUS) IMPLANT
BUR MATCHSTICK NEURO 3.0 LAGG (BURR) ×2 IMPLANT
CANISTER SUCTION 2500CC (MISCELLANEOUS) ×2 IMPLANT
CLOTH BEACON ORANGE TIMEOUT ST (SAFETY) ×2 IMPLANT
CONT SPEC 4OZ CLIKSEAL STRL BL (MISCELLANEOUS) ×2 IMPLANT
DECANTER SPIKE VIAL GLASS SM (MISCELLANEOUS) ×2 IMPLANT
DERMABOND ADHESIVE PROPEN (GAUZE/BANDAGES/DRESSINGS) ×1
DERMABOND ADVANCED .7 DNX6 (GAUZE/BANDAGES/DRESSINGS) ×1 IMPLANT
DRAPE LAPAROTOMY 100X72X124 (DRAPES) ×2 IMPLANT
DRAPE MICROSCOPE LEICA (MISCELLANEOUS) ×2 IMPLANT
DRAPE POUCH INSTRU U-SHP 10X18 (DRAPES) ×2 IMPLANT
DRAPE SURG 17X23 STRL (DRAPES) ×2 IMPLANT
DURAPREP 26ML APPLICATOR (WOUND CARE) ×2 IMPLANT
ELECT BLADE 4.0 EZ CLEAN MEGAD (MISCELLANEOUS) ×2
ELECT REM PT RETURN 9FT ADLT (ELECTROSURGICAL) ×2
ELECTRODE BLDE 4.0 EZ CLN MEGD (MISCELLANEOUS) ×1 IMPLANT
ELECTRODE REM PT RTRN 9FT ADLT (ELECTROSURGICAL) ×1 IMPLANT
GAUZE SPONGE 4X4 16PLY XRAY LF (GAUZE/BANDAGES/DRESSINGS) IMPLANT
GLOVE BIO SURGEON STRL SZ 6.5 (GLOVE) ×4 IMPLANT
GLOVE BIOGEL PI IND STRL 6.5 (GLOVE) ×1 IMPLANT
GLOVE BIOGEL PI IND STRL 7.5 (GLOVE) ×1 IMPLANT
GLOVE BIOGEL PI INDICATOR 6.5 (GLOVE) ×1
GLOVE BIOGEL PI INDICATOR 7.5 (GLOVE) ×1
GLOVE ECLIPSE 7.0 STRL STRAW (GLOVE) ×2 IMPLANT
GLOVE EXAM NITRILE LRG STRL (GLOVE) IMPLANT
GLOVE EXAM NITRILE MD LF STRL (GLOVE) IMPLANT
GLOVE EXAM NITRILE XL STR (GLOVE) IMPLANT
GLOVE EXAM NITRILE XS STR PU (GLOVE) IMPLANT
GOWN BRE IMP SLV AUR LG STRL (GOWN DISPOSABLE) ×4 IMPLANT
GOWN BRE IMP SLV AUR XL STRL (GOWN DISPOSABLE) IMPLANT
GOWN STRL REIN 2XL LVL4 (GOWN DISPOSABLE) IMPLANT
HEMOSTAT POWDER SURGIFOAM 1G (HEMOSTASIS) ×2 IMPLANT
KIT BASIN OR (CUSTOM PROCEDURE TRAY) ×2 IMPLANT
KIT ROOM TURNOVER OR (KITS) ×2 IMPLANT
NEEDLE HYPO 18GX1.5 BLUNT FILL (NEEDLE) ×2 IMPLANT
NEEDLE HYPO 25X1 1.5 SAFETY (NEEDLE) ×2 IMPLANT
NEEDLE SPNL 18GX3.5 QUINCKE PK (NEEDLE) IMPLANT
NS IRRIG 1000ML POUR BTL (IV SOLUTION) ×2 IMPLANT
PACK LAMINECTOMY NEURO (CUSTOM PROCEDURE TRAY) ×2 IMPLANT
PAD ARMBOARD 7.5X6 YLW CONV (MISCELLANEOUS) ×6 IMPLANT
PATTIES SURGICAL .5 X.5 (GAUZE/BANDAGES/DRESSINGS) ×2 IMPLANT
RUBBERBAND STERILE (MISCELLANEOUS) ×4 IMPLANT
SPONGE GAUZE 4X4 12PLY (GAUZE/BANDAGES/DRESSINGS) IMPLANT
SPONGE LAP 4X18 X RAY DECT (DISPOSABLE) ×2 IMPLANT
SPONGE SURGIFOAM ABS GEL SZ50 (HEMOSTASIS) ×2 IMPLANT
STRIP CLOSURE SKIN 1/2X4 (GAUZE/BANDAGES/DRESSINGS) IMPLANT
SUT VIC AB 0 CT1 18XCR BRD8 (SUTURE) ×1 IMPLANT
SUT VIC AB 0 CT1 8-18 (SUTURE) ×2
SUT VIC AB 2-0 CT1 18 (SUTURE) IMPLANT
SUT VIC AB 3-0 FS2 27 (SUTURE) ×2 IMPLANT
SUT VIC AB 3-0 SH 8-18 (SUTURE) ×2 IMPLANT
SYR 20ML ECCENTRIC (SYRINGE) ×2 IMPLANT
SYR 3ML LL SCALE MARK (SYRINGE) ×2 IMPLANT
TOWEL OR 17X24 6PK STRL BLUE (TOWEL DISPOSABLE) ×2 IMPLANT
TOWEL OR 17X26 10 PK STRL BLUE (TOWEL DISPOSABLE) ×2 IMPLANT
WATER STERILE IRR 1000ML POUR (IV SOLUTION) ×2 IMPLANT

## 2013-06-03 NOTE — Progress Notes (Signed)
06/03/13 1202  OBSTRUCTIVE SLEEP APNEA  Have you ever been diagnosed with sleep apnea through a sleep study? No  Do you snore loudly (loud enough to be heard through closed doors)?  1  Do you often feel tired, fatigued, or sleepy during the daytime? 1  Has anyone observed you stop breathing during your sleep? 0  Do you have, or are you being treated for high blood pressure? 1  BMI more than 35 kg/m2? 1  Age over 34 years old? 0  Neck circumference greater than 40 cm/18 inches? 0  Gender: 0  Obstructive Sleep Apnea Score 4  Score 4 or greater  Results sent to PCP   

## 2013-06-03 NOTE — Transfer of Care (Signed)
Immediate Anesthesia Transfer of Care Note  Patient: Darlene Frazier  Procedure(s) Performed: Procedure(s) with comments: LEFT LUMBAR FIVE SACRAL ONE LAMINECTOMY/DECOMPRESSION MICRODISCECTOMY  (Left) - LEFT L5S1 microdiskectomy  Patient Location: PACU  Anesthesia Type:General  Level of Consciousness: awake, alert  and oriented  Airway & Oxygen Therapy: Patient Spontanous Breathing and Patient connected to nasal cannula oxygen  Post-op Assessment: Report given to PACU RN and Post -op Vital signs reviewed and stable  Post vital signs: Reviewed and stable  Complications: No apparent anesthesia complications

## 2013-06-03 NOTE — Progress Notes (Addendum)
Pt c/o nausea states that it may be from taking her meds on an empty stomach.  Dr  Gelene Mink called and new orders noted. At 1225 now states some relief after zofran.  Phenergan given at this time.

## 2013-06-03 NOTE — Anesthesia Procedure Notes (Signed)
Procedure Name: Intubation Date/Time: 06/03/2013 3:54 PM Performed by: Tyrone Nine Pre-anesthesia Checklist: Patient identified, Emergency Drugs available, Suction available, Patient being monitored and Timeout performed Patient Re-evaluated:Patient Re-evaluated prior to inductionOxygen Delivery Method: Circle system utilized Preoxygenation: Pre-oxygenation with 100% oxygen Intubation Type: IV induction Ventilation: Mask ventilation without difficulty Laryngoscope Size: Mac and 3 Grade View: Grade I Tube size: 7.5 mm Number of attempts: 1 Airway Equipment and Method: Stylet Placement Confirmation: ETT inserted through vocal cords under direct vision,  positive ETCO2 and breath sounds checked- equal and bilateral Secured at: 21 cm Tube secured with: Tape Dental Injury: Teeth and Oropharynx as per pre-operative assessment

## 2013-06-03 NOTE — Op Note (Signed)
PREOP DIAGNOSIS: Lumbar disc herniation, Left L5-S1  POSTOP DIAGNOSIS: Same  PROCEDURE: 1. L5-S1 laminotomy and microdiscectomy for decompression of nerve root 2. Use of operating microscope  SURGEON: Dr. Lisbeth Renshaw, MD  ASSISTANT: Dr. Aliene Beams  ANESTHESIA: General Endotracheal  EBL: 100  SPECIMENS: None  DRAINS: None  COMPLICATIONS: None immediate  CONDITION: Stable to PACU  HISTORY: Darlene Frazier is a 32 y.o. female initially presented to the outpatient neurosurgery clinic complaining of symptoms consistent with a left S1 radiculopathy. She underwent MRI which demonstrated a large left-sided L5-S1 herniation. Treatment options were discussed with the patient and she elected to proceed with laminotomy and microdiscectomy after the risks of the procedure were explained in detail.  PROCEDURE IN DETAIL: After informed consent was obtained and witnessed, the patient was brought to the operating room. After induction of general anesthesia, the patient was positioned on the operative table in the prone position with all pressure points meticulously padded. The skin of the low back was then prepped and draped in the usual sterile fashion.  Under fluoroscopy, the correct level was identified and marked out on the skin, and after timeout was conducted, the skin was infiltrated with local anesthetic. Skin incision was then made sharply and Bovie electrocautery was used to dissect the subcutaneous tissue until the lumbodorsal fascia was identified. The fashion was then incised using Bovie electrocautery and the lamina at the L5 and S1 levels was identified and dissection was carried out in the subperiosteal plane. Self-retaining retractor was then placed, and intraoperative x-ray was taken to confirm we were at the correct level.  Using a high-speed drill, laminotomy was completed with a partial medial facetectomy. The ligamentum flavum was then identified and removed and the  lateral edge of the thecal sac was identified. This was then traced down to identify the traversing nerve root. Dissection was then carried out superior and lateral to the nerve root to identify the disc herniation. The posterior annulus was then incised and using a combination of dissectors, curettes, and Rogers, the herniated disc fragment was removed. The decompression of the nerve root was confirmed using a dissector.  Hemostasis was then secured using a combination of morcellized Gelfoam and thrombin and bipolar electrocautery. The wound is irrigated with copious amounts of antibiotic saline irrigation. The nerve root was then covered with a long-acting steroid solution. Self-retaining retractor was then removed, and the wound is closed in layers using a combination of interrupted 0 Vicryl and 3-0 Vicryl stitches. The skin was closed using standard skin glue.  At the end of the case all sponge, needle, and instrument counts were correct. The patient was then transferred to the stretcher and taken to the postanesthesia care unit in stable hemodynamic condition.

## 2013-06-03 NOTE — Anesthesia Postprocedure Evaluation (Signed)
Anesthesia Post Note  Patient: Darlene Frazier  Procedure(s) Performed: Procedure(s) (LRB): LEFT LUMBAR FIVE SACRAL ONE LAMINECTOMY/DECOMPRESSION MICRODISCECTOMY  (Left)  Anesthesia type: general  Patient location: PACU  Post pain: Pain level controlled  Post assessment: Patient's Cardiovascular Status Stable  Last Vitals:  Filed Vitals:   06/03/13 1935  BP:   Pulse: 107  Temp:   Resp: 19    Post vital signs: Reviewed and stable  Level of consciousness: sedated  Complications: No apparent anesthesia complications

## 2013-06-03 NOTE — Progress Notes (Signed)
06/03/13 1202  OBSTRUCTIVE SLEEP APNEA  Have you ever been diagnosed with sleep apnea through a sleep study? No  Do you snore loudly (loud enough to be heard through closed doors)?  1  Do you often feel tired, fatigued, or sleepy during the daytime? 1  Has anyone observed you stop breathing during your sleep? 0  Do you have, or are you being treated for high blood pressure? 1  BMI more than 35 kg/m2? 1  Age over 34 years old? 0  Neck circumference greater than 40 cm/18 inches? 0  Gender: 0  Obstructive Sleep Apnea Score 4  Score 4 or greater  Results sent to PCP

## 2013-06-03 NOTE — H&P (Signed)
HISTORY OF PRESENT ILLNESS: 1.  Low back pain   Darlene Frazier is a 32 yo woman who presents to the office today complaining of approximately 2 month history of severe low back and left leg pain.  She describes the pain as a deep burning throbbing sensation which extends from her lower back across the left hip, down the back of her thigh, calf, and abates in the foot.  She'll do so describes decreased sensation with a feeling of pins and needles in the lateral aspect of her left foot.  The pain is excruciating in severity, limiting her ability to walk.  Because of this, she has been walking with a cane.  She denies any similar symptoms on the right side, although she does report some back pain extending across to the right side.  She denies any new bowel or bladder symptoms, or upper extremity symptoms.     PAST MEDICAL/SURGICAL HISTORY  (Detailed)  Disease/disorder Onset Date Management Date Comments    Kidney stent removal 2007 CRR 05/29/2013 -    Kidney stent placement 2007 CRR 05/29/2013 -    Foot nodule surgery 1999 CRR 05/29/2013 -    Cholecystectomy    Asthma, exercise-induced    CRR 05/29/2013 -  Bipolar disorder, full remission    CRR 05/29/2013 -  Fibromyalgia    CRR 05/29/2013 -  Gastroesophageal reflux disease    CRR 05/29/2013 -  Hiatal hernia    CRR 05/29/2013 -  Hypertension      Kidney stones    CRR 05/29/2013 -      PAST MEDICAL HISTORY, SURGICAL HISTORY, FAMILY HISTORY, SOCIAL HISTORY AND REVIEW OF SYSTEMS I have reviewed the patient's past medical, surgical, family and social history as well as the comprehensive review of systems as included on the [facility] history form dated 05/29/2013, which I have signed.  Family History  (Detailed)  Relationship Family Member Name Deceased Age at Death Condition Onset Age Cause of Death      Family history of Asthma  N      Family history of Myocardial infarction  N      Family history of Diabetes  mellitus type 2  N      Family history of Depression  N      Family history of Hypertension  N  Father  N  Alive and well 78   Mother    Asthma 27 N  Mother    Hypertension 36 N   SOCIAL HISTORY  (Detailed) Tobacco use reviewed. Preferred language is Unknown.   Smoking status: Never smoker.  SMOKING STATUS Use Status Type Smoking Status Usage Per Day Years Used Total Pack Years  no/never  Never smoker             Medications (added, continued or stopped this visit):   Medication Dose Prescribed Else Ind Started Stopped  Cymbalta 30 mg capsule,delayed release 30 mg Y    Neurontin UNKNOWN Y    Percocet 5 mg-325 mg tablet 5 mg-325 mg N 05/29/2013   Robaxin-750 750 mg tablet 750 mg Y    Topamax UNKNOWN Y    tramadol 50 mg tablet 50 mg Y       Allergies:  REVIEW OF SYSTEMS: System Neg/Pos Details  Constitutional Positive Night sweats, Weight loss.  Constitutional Negative Chills, fatigue, fever and malaise.  ENMT Negative Ear drainage, hearing loss, nasal drainage, otalgia, sinus pressure and sore throat.  ENMT Positive Balance disturbance.  Eyes Negative  Eye discharge, eye pain and vision changes.  Respiratory Negative Chronic cough, cough, dyspnea, known TB exposure and wheezing.  Respiratory Positive Asthma.  Cardio Negative Chest pain, claudication, edema and irregular heartbeat/palpitations.  Cardio Positive HBP, swelling in feet/hands, leg pain while walkin.  GI Positive Nausea, Indigestion/pain with eating.  GI Negative Abdominal pain, blood in stool, change in stool pattern, constipation, decreased appetite, diarrhea, heartburn and vomiting.  Endocrine Positive Excessive thirst or urinantion.  Endocrine Negative Cold intolerance, heat intolerance, polydipsia and polyphagia.  Neuro Positive Extremity weakness, Memory impairment, Difficulty with your speech,inability to concentra.  Neuro Negative Dizziness, gait disturbance, headache, numbness in extremities,  seizures and tremors.  Psych Positive Anxiety, Depression, Other Psychiatric disorder/treatment.  Psych Negative Insomnia.  Integumentary Negative Brittle hair, brittle nails, change in shape/size of mole(s), hair loss, hirsutism, hives, pruritus, rash and skin lesion.  MS Positive Back pain, Joint pain, Neck pain, Arthritic manifestations.  MS Negative Joint swelling and muscle weakness.  Hema/Lymph Negative Easy bleeding, easy bruising and lymphadenopathy.  Allergic/Immuno Negative Contact allergy, environmental allergies, food allergies and seasonal allergies.  Reproductive Negative Breast discharge, breast lump(s), dysmenorrhea, dyspareunia, history of abnormal PAP smear and vaginal discharge.  GU Negative Dysuria, hematuria, hot flashes, irregular menses, polyuria, urinary frequency, urinary incontinence and urinary retention.  GU Positive Kidney stones.    Vitals Date Temp F BP Pulse Ht In Wt Lb BMI BSA Pain Score  05/29/2013  130/85 121 66 254 41  10/10     PHYSICAL EXAM General General Appearance: normal Mood/Affect: normal Orientation: normal Pulses/Edema: 2+ bilateral radial / DP pulses Gait/Station: antalgic, unable to toe walk or heel walk Coordination: normal    Skin Right Upper Extremity: normal Left Upper Extremity: normal Right Lower Extremity: normal Left Lower Extremity: normal  Inspection/Palpation   Right Left  Lumbar Spine: normal normal Upper Extremity: normal normal Lower Extremity: normal normal  Stability Cervical Spine: normal Right Upper Extremity: normal Left Upper Extremity: normal Right Lower Extremity: normal Left Lower Extremity: normal  Range of Motion Cervical Spine: normal Right Upper Extremity: normal Left Upper Extremity: normal Right Lower Extremity: normal Left Lower Extremity: normal  Motor Strength Upper and lower extremity motor strength was tested in the clinically pertinent muscles .Any abnormal findings will be noted  below..   Right Left Deltoid: normal normal Biceps: normal normal Triceps: normal normal Infraspinatus: normal normal Wrist Extensor: normal normal Grip: normal normal Hip Flexor: normal normal Knee Extensor: normal normal Tib Anterior: normal normal EHL: normal normal Medial Gastroc: normal normal  Give away weakness noted in the left lower extremity.  Sensory Sensation was tested at C5 to T1 and L2 to S1 .Any abnormal findings will be noted below..  Right Left C5: normal normal  C6: normal normal C7: normal normal C8: normal normal T1: normal normal Median Hand: normal normal   Ulnar Hand: normal normal   L2: normal normal  L3: normal normal  L4: normal normal  L5: normal decreased  S1: normal decreased  Nondermatomal sensory deficits noted in the left forearm decreased LT.  Motor and other Tests    Right Left Hoffman's: absent absent Babinski: downgoing downgoing  Muscle Stretch Reflexes Upper and lower extremity reflexes were tested in the clinically pertinent muscles .Any abnormal findings will be noted below..  Right Left Bicep: normal normal Brachioradialis: normal normal Patellar: normal increase Achilles: decrease decrease       DIAGNOSTIC RESULTS MRI of the lumbar spine was reviewed which demonstrates early spondylitic changes at  the L5-S1 interspace, as well as a large left eccentric herniated disc fragment with compression of the traversing left S1 nerve root.    IMPRESSION 32yo woman with left S1 radiculopathy and left L5-S1 disc herniation    Assessment/Plan # Detail Type Description   1. 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        - Laminotomy, microdiscectomy left L5-S1 - Rx for Percoset given  The radiographic findings and general treatment options were discussed in detail with the patient and her friend. I offered a trial of conservative management with anti-inflammatories, PT, and possible SNRB. I  also offered the above surgical decompression. The risks of the surgery were discussed in detail, including but not limited to bleeding, infection, nerve/spinal cord injury resulting in leg/foot weakness or bowel/bladder dysfunction, and CSF leak. The patient and her friend understood our discussion and are willing to proceed with surgery. All their questions were answered.

## 2013-06-03 NOTE — Anesthesia Preprocedure Evaluation (Addendum)
Anesthesia Evaluation  Patient identified by MRN, date of birth, ID band Patient awake    Reviewed: Allergy & Precautions, H&P , NPO status , Patient's Chart, lab work & pertinent test results, reviewed documented beta blocker date and time   Airway Mallampati: II TM Distance: >3 FB Neck ROM: full    Dental  (+) Teeth Intact and Dental Advisory Given   Pulmonary asthma , pneumonia -, resolved,  breath sounds clear to auscultation  Pulmonary exam normal       Cardiovascular Exercise Tolerance: Good hypertension, Pt. on medications Rhythm:regular Rate:Tachycardia  03-Jun-2013 11:55:44 [facility] ROUTINE RECORD Sinus tachycardia Otherwise normal ECG  Pt states she has never taken the toprol.   Neuro/Psych  Headaches, PSYCHIATRIC DISORDERS Bipolar Disorder Bipolar Disorder  Neuromuscular disease    GI/Hepatic Neg liver ROS, hiatal hernia, GERD-  Medicated and Controlled,  Endo/Other  Morbid obesity  Renal/GU Renal InsufficiencyRenal disease  negative genitourinary   Musculoskeletal  (+) Fibromyalgia -  Abdominal   Peds  Hematology negative hematology ROS (+)   Anesthesia Other Findings See surgeon's H&P  Obese  Reproductive/Obstetrics negative OB ROS                      Anesthesia Physical Anesthesia Plan  ASA: III  Anesthesia Plan: General   Post-op Pain Management:    Induction: Intravenous  Airway Management Planned: Oral ETT  Additional Equipment:   Intra-op Plan:   Post-operative Plan: Extubation in OR  Informed Consent: I have reviewed the patients History and Physical, chart, labs and discussed the procedure including the risks, benefits and alternatives for the proposed anesthesia with the patient or authorized representative who has indicated his/her understanding and acceptance.   Dental Advisory Given  Plan Discussed with: CRNA and  Surgeon  Anesthesia Plan Comments:         Anesthesia Quick Evaluation

## 2013-06-03 NOTE — Progress Notes (Signed)
Pt is post left L5-S1 microdiscectomy. C/o Generalized back pain.   EXAM:  BP 110/82  Pulse 102  Temp(Src) 99.2 F (37.3 C) (Oral)  Resp 13  SpO2 97%  Awake, alert 5/5 BUE/BLE  Wound c/d/i,  IMPRESSION:  32 y.o. female POD# 0 s/p L5-S1 microdiscectomy, doing well postop  PLAN: - admit to obs, for likely D/C tomorrow am. - Cont routine postop care with PRN pain meds, OOB this evening

## 2013-06-04 MED ORDER — OXYCODONE-ACETAMINOPHEN 10-325 MG PO TABS: 1.0000 | ORAL_TABLET | ORAL | Status: DC | PRN

## 2013-06-04 MED ORDER — DSS 100 MG PO CAPS: 100.0000 mg | ORAL_CAPSULE | Freq: Two times a day (BID) | ORAL | Status: DC

## 2013-06-04 NOTE — Discharge Summary (Signed)
Physician Discharge Summary  Patient ID: Darlene Frazier MRN: [id number] DOB/AGE: 34-Sep-1982 34 y.o.  Admit date: 06/03/2013 Discharge date: 06/04/2013  Admission Diagnoses: L5-S1 disc herniation  Discharge Diagnoses: Same Active Problems:   * No active hospital problems. *   Discharged Condition: good  Hospital Course: The patient is a 32 year old woman who initially presented to the outpatient clinic with signs and symptoms consistent with a left S1 radiculopathy. She underwent surgical decompression yesterday, and had a uncomplicated postoperative course, with a intact neurologic exam postoperatively. Overnight her pain was controlled, she was angulating, voiding without difficulty, tolerating diet, and therefore deemed stable for discharge at that time. Due to her long-standing pain, the patient has been walking with a cane, and requested a lumbar corset.  Consults: None  Significant Diagnostic Studies: none  Treatments: Surgery - Left L5-S1 discectomy  Discharge Exam: Blood pressure 103/64, pulse 113, temperature 99 F (37.2 C), temperature source Oral, resp. rate 18, SpO2 98.00%. Awake, Alert, Oriented Speech fluent, appropriate CN grossly intact 5/5 BUE/BLE  Disposition: 01-Home or Self Care     Medication List    STOP taking these medications       oxyCODONE-acetaminophen 5-325 MG per tablet  Commonly known as:  PERCOCET/ROXICET  Replaced by:  oxyCODONE-acetaminophen 10-325 MG per tablet      TAKE these medications       diclofenac 50 MG tablet  Commonly known as:  CATAFLAM  Take 50 mg by mouth See admin instructions. Take one tablet twice daily. May take one additional tablet if needed for pain daily. Do not exceed three tablets in 24 hours.     DSS 100 MG Caps  Take 100 mg by mouth 2 (two) times daily.     DULoxetine 30 MG capsule  Commonly known as:  CYMBALTA  Take 30 mg by mouth 3 (three) times daily.     ergocalciferol 50000 UNITS capsule  Commonly  known as:  VITAMIN D2  Take 50,000 Units by mouth once a week.     gabapentin 300 MG capsule  Commonly known as:  NEURONTIN  Take 300-900 mg by mouth 2 (two) times daily. 1 capsule in the morning and 2-3 capsules at bedtime.     lidocaine 5 %  Commonly known as:  LIDODERM  Place 1 patch onto the skin daily as needed (pain). Remove & Discard patch within 12 hours or as directed by MD     Melatonin 10 MG Tabs  Take 1 tablet by mouth at bedtime as needed (sleep).     methocarbamol 750 MG tablet  Commonly known as:  ROBAXIN  Take 750-1,500 mg by mouth 3 (three) times daily.     metoprolol succinate 25 MG 24 hr tablet  Commonly known as:  TOPROL-XL  Take 25 mg by mouth daily.     oxyCODONE-acetaminophen 10-325 MG per tablet  Commonly known as:  PERCOCET  Take 1 tablet by mouth every 4 (four) hours as needed for pain.     promethazine 12.5 MG tablet  Commonly known as:  PHENERGAN  Take 12.5 mg by mouth every 8 (eight) hours as needed for nausea.     topiramate 25 MG capsule  Commonly known as:  TOPAMAX  Take 100 mg by mouth at bedtime.     traMADol 50 MG tablet  Commonly known as:  ULTRAM  Take 50 mg by mouth every 8 (eight) hours as needed for pain.         Signed: Lisbeth Renshaw,  C 06/04/2013, 7:50 AM

## 2013-06-04 NOTE — Progress Notes (Signed)
Pt. Alert and oriented,follows simple instructions, denies pain. Incision area without swelling, redness or S/S of infection. Voiding adequate clear yellow urine. Moving all extremities well and vitals stable and documented. Lumbar surgery notes instructions given to patient and family member for home safety and precautions. Pt. and family stated understanding of instructions given.  

## 2013-06-04 NOTE — Progress Notes (Signed)
Orthopedic Tech Progress Note Patient Details:  Darlene Frazier 1981-06-02 [id number]  Patient ID: Darlene Frazier, female   DOB: 03-19-1981, 32 y.o.   MRN: [id number]   Darlene Frazier 06/04/2013, 8:10 AMCalled [employer] for lumbar corset.

## 2013-06-04 NOTE — Progress Notes (Signed)
Orthopedic Tech Progress Note Patient Details:  Darlene Frazier 05-03-1981 [id number]  Patient ID: [name], female   DOB: Feb 09, 1981, 32 y.o.   MRN: [id number]   [name] 06/04/2013, 10:21 AMLumbar corset completed by [employer].

## 2013-06-06 ENCOUNTER — Encounter (HOSPITAL_COMMUNITY): Payer: Self-pay | Admitting: Neurosurgery

## 2013-08-07 ENCOUNTER — Other Ambulatory Visit: Payer: Self-pay

## 2014-05-22 ENCOUNTER — Other Ambulatory Visit (HOSPITAL_COMMUNITY): Payer: Self-pay | Admitting: Internal Medicine

## 2014-05-22 ENCOUNTER — Ambulatory Visit (HOSPITAL_COMMUNITY)
Admission: RE | Admit: 2014-05-22 | Discharge: 2014-05-22 | Disposition: A | Discharge status: Home / Self Care | Payer: BC Managed Care – PPO | Source: Ambulatory Visit | Attending: Internal Medicine | Admitting: Internal Medicine

## 2014-05-22 DIAGNOSIS — M25519 Pain in unspecified shoulder: Secondary | ICD-10-CM | POA: Insufficient documentation

## 2014-05-22 DIAGNOSIS — M25511 Pain in right shoulder: Secondary | ICD-10-CM

## 2014-07-02 ENCOUNTER — Other Ambulatory Visit: Payer: Self-pay | Admitting: Primary Care

## 2014-07-09 ENCOUNTER — Other Ambulatory Visit: Payer: Self-pay | Admitting: *Deleted

## 2014-07-09 ENCOUNTER — Encounter: Payer: Self-pay | Admitting: *Deleted

## 2014-07-09 ENCOUNTER — Telehealth: Payer: Self-pay | Admitting: *Deleted

## 2014-07-09 DIAGNOSIS — IMO0002 Reserved for concepts with insufficient information to code with codable children: Secondary | ICD-10-CM

## 2014-07-09 NOTE — Telephone Encounter (Signed)
Attempted to contact patient, no answer, left a message that patient has an appointment on October 27 at 0915, please call clinic and verify.  Will send letter.  Letter sent.

## 2014-07-15 NOTE — Telephone Encounter (Signed)
Called patient, no answer- left message that we are trying to reach you in regards to an appt, please call us back at the clinics. Called patient at significant other's phone number and he answered and stated he would have her call us back. Patient is a referral patient awaiting appt

## 2014-07-15 NOTE — Telephone Encounter (Signed)
Patient called back and left message on nurse line stating she called our front office and got the information about her ultrasound appt but wanted to return our call. Called patient, no answer- left message stating we were just trying to contact her to make sure she was aware of her appt but if she has other concerns she may call us back.

## 2014-07-28 ENCOUNTER — Ambulatory Visit (HOSPITAL_COMMUNITY)
Admission: RE | Admit: 2014-07-28 | Discharge: 2014-07-28 | Disposition: A | Discharge status: Home / Self Care | Payer: BC Managed Care – PPO | Source: Ambulatory Visit | Attending: Obstetrics & Gynecology | Admitting: Obstetrics & Gynecology

## 2014-07-28 DIAGNOSIS — N941 Dyspareunia: Secondary | ICD-10-CM | POA: Insufficient documentation

## 2014-07-28 DIAGNOSIS — IMO0002 Reserved for concepts with insufficient information to code with codable children: Secondary | ICD-10-CM

## 2014-07-28 DIAGNOSIS — N926 Irregular menstruation, unspecified: Secondary | ICD-10-CM | POA: Insufficient documentation

## 2014-09-03 ENCOUNTER — Encounter: Payer: BC Managed Care – PPO | Admitting: Obstetrics & Gynecology

## 2014-10-14 ENCOUNTER — Encounter: Payer: Self-pay | Admitting: Obstetrics & Gynecology

## 2014-10-14 ENCOUNTER — Ambulatory Visit (INDEPENDENT_AMBULATORY_CARE_PROVIDER_SITE_OTHER): Payer: Self-pay | Admitting: Obstetrics & Gynecology

## 2014-10-14 VITALS — BP 124/67 | HR 97 | Temp 98.4°F | Ht 66.0 in | Wt 280.8 lb

## 2014-10-14 DIAGNOSIS — F525 Vaginismus not due to a substance or known physiological condition: Secondary | ICD-10-CM

## 2014-10-14 MED ORDER — AMITRIPTYLINE HCL 50 MG PO TABS: 150.0000 mg | ORAL_TABLET | Freq: Every day | ORAL | Status: DC
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
## 2014-10-16 ENCOUNTER — Telehealth: Payer: Self-pay | Admitting: *Deleted

## 2014-10-16 DIAGNOSIS — F525 Vaginismus not due to a substance or known physiological condition: Secondary | ICD-10-CM

## 2014-10-16 MED ORDER — AMITRIPTYLINE HCL 50 MG PO TABS: 50.0000 mg | ORAL_TABLET | Freq: Every day | ORAL | Status: DC

## 2014-10-16 NOTE — Telephone Encounter (Addendum)
Pt left message stating that she was given Rx for Amitriptyline by Dr. Erin FullingHarraway-Smith.  She is to take 3 tablets @ bedtime but was only given a quantity of 30 pills. She is wondering if this was a mistake. Message sent to Dr. Erin FullingHarraway-Smith for review.    Received response from Dr. Erin FullingHarraway-Smith with medication dosage clarification. She wants pt to take Elavil 50 mg (1 tablet) at bedtime.  I called pt back with updated medication dosage information and she voiced understanding. I also notified [facility] pharmacy of correct dosage and that pt has been informed.

## 2014-12-29 ENCOUNTER — Encounter (HOSPITAL_BASED_OUTPATIENT_CLINIC_OR_DEPARTMENT_OTHER): Payer: Self-pay | Admitting: *Deleted

## 2014-12-29 ENCOUNTER — Emergency Department (HOSPITAL_BASED_OUTPATIENT_CLINIC_OR_DEPARTMENT_OTHER)
Admission: EM | Admit: 2014-12-29 | Discharge: 2014-12-29 | Disposition: A | Discharge status: Home / Self Care | Payer: Self-pay | Attending: Emergency Medicine | Admitting: Emergency Medicine

## 2014-12-29 DIAGNOSIS — F419 Anxiety disorder, unspecified: Secondary | ICD-10-CM | POA: Insufficient documentation

## 2014-12-29 DIAGNOSIS — Z87442 Personal history of urinary calculi: Secondary | ICD-10-CM | POA: Insufficient documentation

## 2014-12-29 DIAGNOSIS — R509 Fever, unspecified: Secondary | ICD-10-CM | POA: Insufficient documentation

## 2014-12-29 DIAGNOSIS — M797 Fibromyalgia: Secondary | ICD-10-CM | POA: Insufficient documentation

## 2014-12-29 DIAGNOSIS — R630 Anorexia: Secondary | ICD-10-CM | POA: Insufficient documentation

## 2014-12-29 DIAGNOSIS — Z8742 Personal history of other diseases of the female genital tract: Secondary | ICD-10-CM | POA: Insufficient documentation

## 2014-12-29 DIAGNOSIS — R110 Nausea: Secondary | ICD-10-CM | POA: Insufficient documentation

## 2014-12-29 DIAGNOSIS — R197 Diarrhea, unspecified: Secondary | ICD-10-CM | POA: Insufficient documentation

## 2014-12-29 DIAGNOSIS — Z8701 Personal history of pneumonia (recurrent): Secondary | ICD-10-CM | POA: Insufficient documentation

## 2014-12-29 DIAGNOSIS — R000 Tachycardia, unspecified: Secondary | ICD-10-CM | POA: Insufficient documentation

## 2014-12-29 DIAGNOSIS — F319 Bipolar disorder, unspecified: Secondary | ICD-10-CM | POA: Insufficient documentation

## 2014-12-29 DIAGNOSIS — Z8719 Personal history of other diseases of the digestive system: Secondary | ICD-10-CM | POA: Insufficient documentation

## 2014-12-29 DIAGNOSIS — R1084 Generalized abdominal pain: Secondary | ICD-10-CM | POA: Insufficient documentation

## 2014-12-29 DIAGNOSIS — Z3202 Encounter for pregnancy test, result negative: Secondary | ICD-10-CM | POA: Insufficient documentation

## 2014-12-29 DIAGNOSIS — R109 Unspecified abdominal pain: Secondary | ICD-10-CM

## 2014-12-29 DIAGNOSIS — Z79899 Other long term (current) drug therapy: Secondary | ICD-10-CM | POA: Insufficient documentation

## 2014-12-29 DIAGNOSIS — J45909 Unspecified asthma, uncomplicated: Secondary | ICD-10-CM | POA: Insufficient documentation

## 2014-12-29 DIAGNOSIS — G4700 Insomnia, unspecified: Secondary | ICD-10-CM | POA: Insufficient documentation

## 2014-12-29 DIAGNOSIS — M1990 Unspecified osteoarthritis, unspecified site: Secondary | ICD-10-CM | POA: Insufficient documentation

## 2014-12-29 DIAGNOSIS — I10 Essential (primary) hypertension: Secondary | ICD-10-CM | POA: Insufficient documentation

## 2014-12-29 LAB — URINALYSIS, ROUTINE W REFLEX MICROSCOPIC
Bilirubin Urine: NEGATIVE
Glucose, UA: NEGATIVE mg/dL
Hgb urine dipstick: NEGATIVE
Ketones, ur: NEGATIVE mg/dL
Nitrite: NEGATIVE
Protein, ur: NEGATIVE mg/dL
Specific Gravity, Urine: 1.011 (ref 1.005–1.030)
Urobilinogen, UA: 0.2 mg/dL (ref 0.0–1.0)
pH: 6 (ref 5.0–8.0)

## 2014-12-29 LAB — CBC WITH DIFFERENTIAL/PLATELET
Basophils Absolute: 0 10*3/uL (ref 0.0–0.1)
Basophils Relative: 0 % (ref 0–1)
Eosinophils Absolute: 0.1 10*3/uL (ref 0.0–0.7)
Eosinophils Relative: 1 % (ref 0–5)
HCT: 37 % (ref 36.0–46.0)
Hemoglobin: 12.2 g/dL (ref 12.0–15.0)
Lymphocytes Relative: 11 % — ABNORMAL LOW (ref 12–46)
Lymphs Abs: 1.2 10*3/uL (ref 0.7–4.0)
MCH: 30 pg (ref 26.0–34.0)
MCHC: 33 g/dL (ref 30.0–36.0)
MCV: 90.9 fL (ref 78.0–100.0)
Monocytes Absolute: 0.8 10*3/uL (ref 0.1–1.0)
Monocytes Relative: 7 % (ref 3–12)
Neutro Abs: 9.5 10*3/uL — ABNORMAL HIGH (ref 1.7–7.7)
Neutrophils Relative %: 81 % — ABNORMAL HIGH (ref 43–77)
Platelets: 408 10*3/uL — ABNORMAL HIGH (ref 150–400)
RBC: 4.07 MIL/uL (ref 3.87–5.11)
RDW: 12.1 % (ref 11.5–15.5)
WBC: 11.7 10*3/uL — ABNORMAL HIGH (ref 4.0–10.5)

## 2014-12-29 LAB — URINE MICROSCOPIC-ADD ON

## 2014-12-29 LAB — COMPREHENSIVE METABOLIC PANEL
ALT: 40 U/L — ABNORMAL HIGH (ref 0–35)
AST: 29 U/L (ref 0–37)
Albumin: 4.1 g/dL (ref 3.5–5.2)
Alkaline Phosphatase: 82 U/L (ref 39–117)
Anion gap: 8 (ref 5–15)
BUN: 14 mg/dL (ref 6–23)
CO2: 24 mmol/L (ref 19–32)
Calcium: 9 mg/dL (ref 8.4–10.5)
Chloride: 107 mmol/L (ref 96–112)
Creatinine, Ser: 0.79 mg/dL (ref 0.50–1.10)
GFR calc Af Amer: >90 mL/min (ref >90)
GFR calc non Af Amer: >90 mL/min (ref >90)
Glucose, Bld: 132 mg/dL — ABNORMAL HIGH (ref 70–99)
Potassium: 3.9 mmol/L (ref 3.5–5.1)
Sodium: 139 mmol/L (ref 135–145)
Total Bilirubin: 0.3 mg/dL (ref 0.3–1.2)
Total Protein: 7.4 g/dL (ref 6.0–8.3)

## 2014-12-29 LAB — LIPASE, BLOOD: Lipase: 22 U/L (ref 11–59)

## 2014-12-29 LAB — PREGNANCY, URINE: Preg Test, Ur: NEGATIVE

## 2014-12-29 MED ORDER — DICYCLOMINE HCL 10 MG PO CAPS
10.0000 mg | ORAL_CAPSULE | Freq: Once | ORAL | Status: AC
  Administered 2014-12-29: 10 mg via ORAL
  Filled 2014-12-29: qty 1

## 2014-12-29 MED ORDER — SODIUM CHLORIDE 0.9 % IV BOLUS (SEPSIS)
1000.0000 mL | Freq: Once | INTRAVENOUS | Status: AC
  Administered 2014-12-29: 1000 mL via INTRAVENOUS

## 2014-12-29 MED ORDER — PROMETHAZINE HCL 25 MG PO TABS: 25.0000 mg | ORAL_TABLET | Freq: Four times a day (QID) | ORAL | Status: DC | PRN

## 2014-12-29 MED ORDER — METOCLOPRAMIDE HCL 5 MG/ML IJ SOLN
10.0000 mg | Freq: Once | INTRAMUSCULAR | Status: AC
  Administered 2014-12-29: 10 mg via INTRAVENOUS
  Filled 2014-12-29: qty 2

## 2014-12-29 MED ORDER — LOPERAMIDE HCL 2 MG PO CAPS
4.0000 mg | ORAL_CAPSULE | Freq: Once | ORAL | Status: AC
  Administered 2014-12-29: 4 mg via ORAL
  Filled 2014-12-29: qty 2

## 2014-12-29 MED ORDER — DICYCLOMINE HCL 20 MG PO TABS: 20.0000 mg | ORAL_TABLET | Freq: Two times a day (BID) | ORAL | Status: DC

## 2014-12-29 MED ORDER — GI COCKTAIL ~~LOC~~
30.0000 mL | Freq: Once | ORAL | Status: AC
  Administered 2014-12-29: 30 mL via ORAL
  Filled 2014-12-29: qty 30

## 2014-12-29 NOTE — ED Provider Notes (Signed)
CSN: [id number]     Arrival date & time 12/29/14  1717 History   First MD Initiated Contact with Patient 12/29/14 1729     Chief Complaint  Patient presents with  . Diarrhea     (Consider location/radiation/quality/duration/timing/severity/associated sxs/prior Treatment) HPI  Pt is a 34yo female with hx of fibromyalgia, bipolar 1 disorder, depression, borderline personality disorder, HTN, GERD, kidney stones with stents in the past, asthma, headaches treated with Topamax, and diffuse arthritis, presenting to ED with c/o persistent diarrhea that started suddenly around 3AM this morning, associated nausea. Pt states she has had about 13 episodes of watery diarrhea with last episode appearing of mucous and scant amount of red blood.  Denies relief with  imodium, which pt states "normally works" as she reports having symptoms of IBS and is normally constipated but has not been "formally diagnosed"  Pt states she has had hot and cold chills today with the diarrhea with diffuse abdominal pain and cramping, 5/10 at worst. No urinary or vaginal symptoms. LMP: mid-February.  Pt not concerned for pregnancy or STDs.  Abdominal surgical hx significant for a cholecystectomy.  States she has not f/u with a GI specialist as she has the [employer] and currently on a wait list to be seen by a specialist. No sick contacts or recent travel. Cannot think of any bad food she may have eaten. Pt does state her GERD is so bad at times her Nexum does not help and she will have to drink milk at night to help with the pain.  No other symptoms at this time.   Pt is tachycardic in Triage and states she has a hx of a fast heart rate that her PCP "keeps and eye on" denies chest pain or SOB.  Pt states her PCP mentioned her rapid heart rate about 1 year ago. States it is normally about 105/  Past Medical History  Diagnosis Date  . Fibromyalgia   . Bipolar 1 disorder   . Depression   . Insomnia   . Borderline personality  disorder   . Hypertension   . GERD (gastroesophageal reflux disease)   . Renal disorder     kidney stones with stent  . Asthma     exercised induced  . Pneumonia     "walking" pneumonia  . H/O hiatal hernia   . Headache(784.0)     treated with Topamax  . Arthritis    Past Surgical History  Procedure Laterality Date  . Kidney stent  2007    placement and removal  . Foot surgery Left 1999    nodule removed  . Cholecystectomy    . Breast surgery Right     biopsy  . Lumbar laminectomy/decompression microdiscectomy Left 06/03/2013    Procedure: LEFT LUMBAR FIVE SACRAL ONE LAMINECTOMY/DECOMPRESSION MICRODISCECTOMY ;  Surgeon: Lisbeth Renshaw, MD;  Location: [facility];  Service: Neurosurgery;  Laterality: Left;  LEFT L5S1 microdiskectomy   Family History  Problem Relation Age of Onset  . Hypertension Mother   . Bipolar disorder Mother   . Hypertension Father   . Depression Brother   . Alcohol abuse Brother   . Bipolar disorder Brother    History  Substance Use Topics  . Smoking status: Never Smoker   . Smokeless tobacco: Never Used  . Alcohol Use: Yes     Comment: socially   OB History    Gravida Para Term Preterm AB TAB SAB Ectopic Multiple Living   0 0 0 0 0  0 0 0 0 0     Review of Systems  Constitutional: Positive for fever, chills and appetite change. Negative for diaphoresis and fatigue.  Respiratory: Negative for cough and shortness of breath.   Gastrointestinal: Positive for nausea, abdominal pain, diarrhea and blood in stool. Negative for vomiting, constipation, anal bleeding and rectal pain.  Genitourinary: Negative for dysuria, urgency, frequency, flank pain and pelvic pain.  Musculoskeletal: Negative for myalgias and back pain.  All other systems reviewed and are negative.     Allergies  Morphine and related  Home Medications   Prior to Admission medications   Medication Sig Start Date End Date Taking? Authorizing Provider  amitriptyline  (ELAVIL) 50 MG tablet Take 1 tablet (50 mg total) by mouth at bedtime. 10/16/14   Willodean Rosenthalarolyn Harraway-Smith, MD  cyclobenzaprine (FLEXERIL) 5 MG tablet Take 5 mg by mouth 3 (three) times daily as needed for muscle spasms. 1/2 pill morning and lunch and 1 tablet at night    Historical Provider, MD  dicyclomine (BENTYL) 20 MG tablet Take 1 tablet (20 mg total) by mouth 2 (two) times daily. 12/29/14   Junius FinnerErin O'Malley, PA-C  DULoxetine (CYMBALTA) 30 MG capsule Take 30 mg by mouth 3 (three) times daily.    Historical Provider, MD  ergocalciferol (VITAMIN D2) 50000 UNITS capsule Take 50,000 Units by mouth once a week.    Historical Provider, MD  gabapentin (NEURONTIN) 300 MG capsule Take 300-900 mg by mouth 2 (two) times daily. 6 pills at night    Historical Provider, MD  ibuprofen (ADVIL,MOTRIN) 800 MG tablet Take 800 mg by mouth every 8 (eight) hours as needed.    Historical Provider, MD  metoprolol succinate (TOPROL-XL) 25 MG 24 hr tablet Take 25 mg by mouth daily.    Historical Provider, MD  ondansetron (ZOFRAN) 4 MG tablet Take 4 mg by mouth every 8 (eight) hours as needed for nausea or vomiting.    Historical Provider, MD  promethazine (PHENERGAN) 25 MG tablet Take 1 tablet (25 mg total) by mouth every 6 (six) hours as needed for nausea or vomiting. 12/29/14   Junius FinnerErin O'Malley, PA-C  QUEtiapine (SEROQUEL) 300 MG tablet Take 300 mg by mouth at bedtime.    Historical Provider, MD  topiramate (TOPAMAX) 25 MG capsule Take 100 mg by mouth at bedtime.    Historical Provider, MD  traMADol (ULTRAM) 50 MG tablet Take 50 mg by mouth every 8 (eight) hours as needed for pain.    Historical Provider, MD   BP 147/72 mmHg  Pulse 106  Temp(Src) 98.9 F (37.2 C) (Oral)  Resp 18  Ht 5\' 6"  (1.676 m)  Wt 283 lb (128.368 kg)  BMI 45.70 kg/m2  SpO2 97% Physical Exam  Constitutional: She appears well-developed and well-nourished. No distress.  Morbidly obese pt lying in exam bed, appears well, NAD  HENT:  Head:  Normocephalic and atraumatic.  Mouth/Throat: Oropharynx is clear and moist.  Eyes: Conjunctivae are normal. No scleral icterus.  Neck: Normal range of motion. Neck supple.  Cardiovascular: Regular rhythm and normal heart sounds.  Tachycardia present.   Pulmonary/Chest: Effort normal and breath sounds normal. No respiratory distress. She has no wheezes. She has no rales. She exhibits no tenderness.  Abdominal: Soft. Bowel sounds are normal. She exhibits no distension and no mass. There is tenderness. There is no rebound and no guarding.  Obese abdomen, soft,Diffuse abdominal pain, worse in upper abdomen. No focal tenderness. No rebound or guarding. No masses. No CVAT  Musculoskeletal:  Normal range of motion.  Neurological: She is alert.  Skin: Skin is warm and dry. She is not diaphoretic.  Psychiatric: Her mood appears anxious.  Rapid speech, appears anxious  Nursing note and vitals reviewed.   ED Course  Procedures (including critical care time) Labs Review Labs Reviewed  CBC WITH DIFFERENTIAL/PLATELET - Abnormal; Notable for the following:    WBC 11.7 (*)    Platelets 408 (*)    Neutrophils Relative % 81 (*)    Neutro Abs 9.5 (*)    Lymphocytes Relative 11 (*)    All other components within normal limits  COMPREHENSIVE METABOLIC PANEL - Abnormal; Notable for the following:    Glucose, Bld 132 (*)    ALT 40 (*)    All other components within normal limits  URINALYSIS, ROUTINE W REFLEX MICROSCOPIC - Abnormal; Notable for the following:    Leukocytes, UA SMALL (*)    All other components within normal limits  URINE MICROSCOPIC-ADD ON - Abnormal; Notable for the following:    Squamous Epithelial / LPF FEW (*)    Bacteria, UA MANY (*)    All other components within normal limits  LIPASE, BLOOD  PREGNANCY, URINE    Imaging Review No results found.   EKG Interpretation None      MDM   Final diagnoses:  Abdominal cramping  Diarrhea  Nausea  Tachycardia    Pt is a  34yo female with multiple medical issues, presenting to ED with c/o persistent diarrhea that started suddenly early this morning with associated nausea but states she has not vomited as she is "holding it in"  No fever. Diffuse abdominal cramping.  Pt is tachycardic at 138, likely due to dehydration.  IV fluids, reglan, immodium and GI cocktail given.  7:24 PM labs: unremarkable. No focal tenderness, not concerned for appendicitis.   HR improved to 104 after initial 1L IV fluids. Pt still c/o abdominal cramping, 2nd liter of fluids started and pt given bentyl which did help with cramping. Pt able to keep down PO fluids in ED. No episodes of diarrhea while in ED. Doubt emergent process taking place at this time including but not limited to SBO, mesenteric ischemia,appendicitis, or diverticulitis.    Will discharge pt home to f/u with PCP. [facility] info also provided. Rx: bentyl and phenergan. Return precautions provided. Pt verbalized understanding and agreement with tx plan.      Junius Finner, PA-C 12/29/14 2215  Gwyneth Sprout, MD 12/29/14 [phone]

## 2014-12-29 NOTE — ED Notes (Signed)
Diarrhea with some blood today.

## 2015-01-14 ENCOUNTER — Other Ambulatory Visit: Payer: Self-pay | Admitting: *Deleted

## 2015-01-14 DIAGNOSIS — F525 Vaginismus not due to a substance or known physiological condition: Secondary | ICD-10-CM

## 2015-01-14 MED ORDER — AMITRIPTYLINE HCL 50 MG PO TABS: 50.0000 mg | ORAL_TABLET | Freq: Every day | ORAL | Status: DC

## 2015-01-14 NOTE — Telephone Encounter (Signed)
Received fax from pt's pharmacy requesting refill of Elavil. Routed to Dr. Erin FullingHarraway-Smith for review.

## 2015-03-14 ENCOUNTER — Encounter (HOSPITAL_BASED_OUTPATIENT_CLINIC_OR_DEPARTMENT_OTHER): Payer: Self-pay | Admitting: *Deleted

## 2015-03-14 ENCOUNTER — Emergency Department (HOSPITAL_BASED_OUTPATIENT_CLINIC_OR_DEPARTMENT_OTHER)
Admission: EM | Admit: 2015-03-14 | Discharge: 2015-03-15 | Disposition: A | Discharge status: Home / Self Care | Payer: Self-pay | Attending: Emergency Medicine | Admitting: Emergency Medicine

## 2015-03-14 DIAGNOSIS — K219 Gastro-esophageal reflux disease without esophagitis: Secondary | ICD-10-CM | POA: Insufficient documentation

## 2015-03-14 DIAGNOSIS — G4700 Insomnia, unspecified: Secondary | ICD-10-CM | POA: Insufficient documentation

## 2015-03-14 DIAGNOSIS — R7309 Other abnormal glucose: Secondary | ICD-10-CM | POA: Insufficient documentation

## 2015-03-14 DIAGNOSIS — Z79899 Other long term (current) drug therapy: Secondary | ICD-10-CM | POA: Insufficient documentation

## 2015-03-14 DIAGNOSIS — F319 Bipolar disorder, unspecified: Secondary | ICD-10-CM | POA: Insufficient documentation

## 2015-03-14 DIAGNOSIS — Z9889 Other specified postprocedural states: Secondary | ICD-10-CM | POA: Insufficient documentation

## 2015-03-14 DIAGNOSIS — M1990 Unspecified osteoarthritis, unspecified site: Secondary | ICD-10-CM | POA: Insufficient documentation

## 2015-03-14 DIAGNOSIS — Z87442 Personal history of urinary calculi: Secondary | ICD-10-CM | POA: Insufficient documentation

## 2015-03-14 DIAGNOSIS — N390 Urinary tract infection, site not specified: Secondary | ICD-10-CM | POA: Insufficient documentation

## 2015-03-14 DIAGNOSIS — I10 Essential (primary) hypertension: Secondary | ICD-10-CM | POA: Insufficient documentation

## 2015-03-14 DIAGNOSIS — M797 Fibromyalgia: Secondary | ICD-10-CM | POA: Insufficient documentation

## 2015-03-14 DIAGNOSIS — K9186 Retained cholelithiasis following cholecystectomy: Secondary | ICD-10-CM | POA: Insufficient documentation

## 2015-03-14 DIAGNOSIS — J45909 Unspecified asthma, uncomplicated: Secondary | ICD-10-CM | POA: Insufficient documentation

## 2015-03-14 DIAGNOSIS — R21 Rash and other nonspecific skin eruption: Secondary | ICD-10-CM | POA: Insufficient documentation

## 2015-03-14 DIAGNOSIS — Z8701 Personal history of pneumonia (recurrent): Secondary | ICD-10-CM | POA: Insufficient documentation

## 2015-03-14 LAB — CBC WITH DIFFERENTIAL/PLATELET
Basophils Absolute: 0.1 10*3/uL (ref 0.0–0.1)
Basophils Relative: 1 % (ref 0–1)
EOS PCT: 2 % (ref 0–5)
Eosinophils Absolute: 0.1 10*3/uL (ref 0.0–0.7)
HCT: 34.1 % — ABNORMAL LOW (ref 36.0–46.0)
HEMOGLOBIN: 11.1 g/dL — AB (ref 12.0–15.0)
LYMPHS ABS: 2 10*3/uL (ref 0.7–4.0)
LYMPHS PCT: 35 % (ref 12–46)
MCH: 29.7 pg (ref 26.0–34.0)
MCHC: 32.6 g/dL (ref 30.0–36.0)
MCV: 91.2 fL (ref 78.0–100.0)
Monocytes Absolute: 0.5 10*3/uL (ref 0.1–1.0)
Monocytes Relative: 9 % (ref 3–12)
NEUTROS ABS: 3.1 10*3/uL (ref 1.7–7.7)
Neutrophils Relative %: 53 % (ref 43–77)
PLATELETS: 307 10*3/uL (ref 150–400)
RBC: 3.74 MIL/uL — ABNORMAL LOW (ref 3.87–5.11)
RDW: 12.6 % (ref 11.5–15.5)
WBC: 5.8 10*3/uL (ref 4.0–10.5)

## 2015-03-14 LAB — URINALYSIS, ROUTINE W REFLEX MICROSCOPIC
BILIRUBIN URINE: NEGATIVE
GLUCOSE, UA: NEGATIVE mg/dL
KETONES UR: NEGATIVE mg/dL
NITRITE: NEGATIVE
PH: 7 (ref 5.0–8.0)
Protein, ur: NEGATIVE mg/dL
Specific Gravity, Urine: 1.026 (ref 1.005–1.030)
Urobilinogen, UA: 0.2 mg/dL (ref 0.0–1.0)

## 2015-03-14 LAB — BASIC METABOLIC PANEL
Anion gap: 5 (ref 5–15)
BUN: 17 mg/dL (ref 6–20)
CHLORIDE: 109 mmol/L (ref 101–111)
CO2: 23 mmol/L (ref 22–32)
Calcium: 8.7 mg/dL — ABNORMAL LOW (ref 8.9–10.3)
Creatinine, Ser: 0.87 mg/dL (ref 0.44–1.00)
GFR calc Af Amer: >60 mL/min (ref >60)
GFR calc non Af Amer: >60 mL/min (ref >60)
GLUCOSE: 130 mg/dL — AB (ref 65–99)
Potassium: 3.6 mmol/L (ref 3.5–5.1)
SODIUM: 137 mmol/L (ref 135–145)

## 2015-03-14 LAB — URINE MICROSCOPIC-ADD ON

## 2015-03-14 MED ORDER — CEPHALEXIN 500 MG PO CAPS: 500.0000 mg | ORAL_CAPSULE | Freq: Four times a day (QID) | ORAL | Status: DC

## 2015-03-14 MED ORDER — FAMOTIDINE IN NACL 20-0.9 MG/50ML-% IV SOLN
20.0000 mg | Freq: Once | INTRAVENOUS | Status: AC
  Administered 2015-03-14: 20 mg via INTRAVENOUS
  Filled 2015-03-14: qty 50

## 2015-03-14 MED ORDER — SODIUM CHLORIDE 0.9 % IV BOLUS (SEPSIS)
1000.0000 mL | Freq: Once | INTRAVENOUS | Status: AC
  Administered 2015-03-14: 1000 mL via INTRAVENOUS

## 2015-03-14 MED ORDER — DIPHENHYDRAMINE HCL 50 MG/ML IJ SOLN
25.0000 mg | Freq: Once | INTRAMUSCULAR | Status: AC
  Administered 2015-03-14: 25 mg via INTRAVENOUS
  Filled 2015-03-14: qty 1

## 2015-03-14 MED ORDER — MUPIROCIN CALCIUM 2 % EX CREA: 1.0000 "application " | TOPICAL_CREAM | Freq: Three times a day (TID) | CUTANEOUS | Status: DC

## 2015-03-14 MED ORDER — CEPHALEXIN 250 MG PO CAPS
500.0000 mg | ORAL_CAPSULE | Freq: Once | ORAL | Status: AC
  Administered 2015-03-14: 500 mg via ORAL
  Filled 2015-03-14: qty 2

## 2015-03-14 MED ORDER — METHYLPREDNISOLONE SODIUM SUCC 125 MG IJ SOLR: 125.0000 mg | Freq: Once | INTRAMUSCULAR | Status: DC

## 2015-03-14 NOTE — Discharge Instructions (Signed)
1 to 2 tablets of 25 mg Benadryl pills every 4-6 hours as needed to a maximum of 300 mg per day. In addition, you may apply a topical hydrocortisone ointment to all affected areas except for the face.  ° °Do not hesitate to call 911 or return to the emergency room if you develop any shortness of breath, wheezing, tongue or lip swelling. ° °Please follow with your primary care doctor in the next 2 days for a check-up. They must obtain records for further management.  ° °Do not hesitate to return to the Emergency Department for any new, worsening or concerning symptoms.  ° ° °

## 2015-03-14 NOTE — ED Notes (Addendum)
Pt states that she developed hives that started this morning and feels like she has them in her mouth as well. Denies any sob. States she feels like she has a coating on her tongue and slight tightness in her throat.  Recently started on zyrtec and prednisone (completed course)  for contact dermatitis to her hand. Denies hives in any other locations. On exam no hives are noted. Pt states she took bendaryl 15 minutes pta.c/o burning and itching to her face.  Denies any foods or new products.

## 2015-03-14 NOTE — ED Provider Notes (Signed)
CSN: [id number]     Arrival date & time 03/14/15  2102 History   First MD Initiated Contact with Patient 03/14/15 2122     Chief Complaint  Patient presents with  . ? allergic reaction      (Consider location/radiation/quality/duration/timing/severity/associated sxs/prior Treatment) HPI  Blood pressure 155/92, pulse 108, temperature 98.7 F (37.1 C), temperature source Oral, resp. rate 23, height 5\' 6"  (1.676 m), weight 292 lb (132.45 kg), last menstrual period 03/13/2015, SpO2 99 %.  Darlene Frazier is a 34 y.o. female complaining of perioral hives that she developed this morning associated with sensation of throat tightness. Patient denies shortness of breath, wheezing, nausea, vomiting, new medications, new soaps or lotions, new pets or new foods. Patient took 50 mg of Benadryl prior to arrival and states that is not helping. She describes her hives as burning and stinging. Minimally pruritic. Patient started a course of Zyrtec and prednisone for contact dermatitis on her left hand she completed the course. Patient states she is always tachycardic, she is trying to get disability for her fibromyalgia. Patient states she has anxiety however this does not feel like a panic attack. Patient states that she drinks 7 L of per day, he endorses polyuria or polydipsia, has a family history of diabetes.  Past Medical History  Diagnosis Date  . Fibromyalgia   . Bipolar 1 disorder   . Depression   . Insomnia   . Borderline personality disorder   . Hypertension   . GERD (gastroesophageal reflux disease)   . Renal disorder     kidney stones with stent  . Asthma     exercised induced  . Pneumonia     "walking" pneumonia  . H/O hiatal hernia   . Headache(784.0)     treated with Topamax  . Arthritis    Past Surgical History  Procedure Laterality Date  . Kidney stent  2007    placement and removal  . Foot surgery Left 1999    nodule removed  . Cholecystectomy    . Breast surgery Right      biopsy  . Lumbar laminectomy/decompression microdiscectomy Left 06/03/2013    Procedure: LEFT LUMBAR FIVE SACRAL ONE LAMINECTOMY/DECOMPRESSION MICRODISCECTOMY ;  Surgeon: Lisbeth Renshaw, MD;  Location: [facility];  Service: Neurosurgery;  Laterality: Left;  LEFT L5S1 microdiskectomy   Family History  Problem Relation Age of Onset  . Hypertension Mother   . Bipolar disorder Mother   . Hypertension Father   . Depression Brother   . Alcohol abuse Brother   . Bipolar disorder Brother    History  Substance Use Topics  . Smoking status: Never Smoker   . Smokeless tobacco: Never Used  . Alcohol Use: Yes     Comment: socially   OB History    Gravida Para Term Preterm AB TAB SAB Ectopic Multiple Living   0 0 0 0 0 0 0 0 0 0      Review of Systems  10 systems reviewed and found to be negative, except as noted in the HPI.   Allergies  Morphine and related  Home Medications   Prior to Admission medications   Medication Sig Start Date End Date Taking? Authorizing Provider  amitriptyline (ELAVIL) 50 MG tablet Take 1 tablet (50 mg total) by mouth at bedtime. 01/14/15   Willodean Rosenthal, MD  cyclobenzaprine (FLEXERIL) 5 MG tablet Take 5 mg by mouth 3 (three) times daily as needed for muscle spasms. 1/2 pill morning and lunch and 1  tablet at night    Historical Provider, MD  dicyclomine (BENTYL) 20 MG tablet Take 1 tablet (20 mg total) by mouth 2 (two) times daily. 12/29/14   Junius Finner, PA-C  DULoxetine (CYMBALTA) 30 MG capsule Take 30 mg by mouth 3 (three) times daily.    Historical Provider, MD  ergocalciferol (VITAMIN D2) 50000 UNITS capsule Take 50,000 Units by mouth once a week.    Historical Provider, MD  gabapentin (NEURONTIN) 300 MG capsule Take 300-900 mg by mouth 2 (two) times daily. 6 pills at night    Historical Provider, MD  ibuprofen (ADVIL,MOTRIN) 800 MG tablet Take 800 mg by mouth every 8 (eight) hours as needed.    Historical Provider, MD  metoprolol  succinate (TOPROL-XL) 25 MG 24 hr tablet Take 25 mg by mouth daily.    Historical Provider, MD  ondansetron (ZOFRAN) 4 MG tablet Take 4 mg by mouth every 8 (eight) hours as needed for nausea or vomiting.    Historical Provider, MD  promethazine (PHENERGAN) 25 MG tablet Take 1 tablet (25 mg total) by mouth every 6 (six) hours as needed for nausea or vomiting. 12/29/14   Junius Finner, PA-C  QUEtiapine (SEROQUEL) 300 MG tablet Take 300 mg by mouth at bedtime.    Historical Provider, MD  topiramate (TOPAMAX) 25 MG capsule Take 100 mg by mouth at bedtime.    Historical Provider, MD  traMADol (ULTRAM) 50 MG tablet Take 50 mg by mouth every 8 (eight) hours as needed for pain.    Historical Provider, MD   BP 125/80 mmHg  Pulse 105  Temp(Src) 98.7 F (37.1 C) (Oral)  Resp 20  Ht  (1.676 m)  Wt 292 lb (132.45 kg)  BMI 47.15 kg/m2  SpO2 99%  LMP 03/13/2015 Physical Exam  Constitutional: She is oriented to person, place, and time. She appears well-developed and well-nourished. No distress.  HENT:  Head: Normocephalic.  Mucous membranes appear dry  Eyes: Conjunctivae and EOM are normal. Pupils are equal, round, and reactive to light.  Neck: Normal range of motion.  Cardiovascular: Normal rate.   Pulmonary/Chest: Effort normal. No stridor.  No stridor or drooling. No posterior pharynx edema, lip or tongue swelling. Pt reclining comfortably, speaking in complete sentences.   No Wheezing, excellent air movement in all fields.     Musculoskeletal: Normal range of motion.  Neurological: She is alert and oriented to person, place, and time.  Skin: Rash noted.  Contact dermatitis to the dorsum of the right fourth and third digits. Patient has minute pustules in the periorbital region, her lips are chapped.  Psychiatric: She has a normal mood and affect.  Nursing note and vitals reviewed.   ED Course  Procedures (including critical care time) Labs Review Labs Reviewed  URINALYSIS, ROUTINE  W REFLEX MICROSCOPIC (NOT AT [facility]) - Abnormal; Notable for the following:    APPearance CLOUDY (*)    Hgb urine dipstick MODERATE (*)    Leukocytes, UA LARGE (*)    All other components within normal limits  CBC WITH DIFFERENTIAL/PLATELET - Abnormal; Notable for the following:    RBC 3.74 (*)    Hemoglobin 11.1 (*)    HCT 34.1 (*)    All other components within normal limits  BASIC METABOLIC PANEL - Abnormal; Notable for the following:    Glucose, Bld 130 (*)    Calcium 8.7 (*)    All other components within normal limits  URINE MICROSCOPIC-ADD ON - Abnormal; Notable for the following:  Squamous Epithelial / LPF MANY (*)    Bacteria, UA MANY (*)    All other components within normal limits    Imaging Review No results found.   EKG Interpretation None      MDM   Final diagnoses:  Elevated random blood glucose level  Rash and nonspecific skin eruption   Filed Vitals:   03/14/15 2115 03/14/15 2333  BP: 155/92 125/80  Pulse: 108 105  Temp: 98.7 F (37.1 C)   TempSrc: Oral   Resp: 23 20  Height:  (1.676 m)   Weight: 292 lb (132.45 kg)   SpO2: 99% 99%    Medications  sodium chloride 0.9 % bolus 1,000 mL (0 mLs Intravenous Stopped 03/14/15 2331)  diphenhydrAMINE (BENADRYL) injection 25 mg (25 mg Intravenous Given 03/14/15 2201)  famotidine (PEPCID) IVPB 20 mg premix (0 mg Intravenous Stopped 03/14/15 2304)    Darlene Frazier is a pleasant 34 y.o. female presenting with periorbital rash and  sensation that throat is tight. No hives are noted, patient has small pustules around the mouth. Her lungs are clear to auscultation, there is no wheezing, there is no posterior pharynx edema, no angioedema. I doubt that this is an allergic reaction. Patient does appear dehydrated, she is tachycardic to 108, states that she is always tachycardic. She notes polyuria and polydipsia. Patient will be given IV, fluid bolus and will check basic blood work and UA to evaluate for  diabetes.  UA shows a moderate amount of hemoglobin leukocytes and many bacteria however it is highly contaminated. Patient reports intermittent dysuria, we'll treat for UTI with Keflex. No indication for antibiotics treatment at this time. Her blood glucose is 130, she has a normal anion gap. Patient finished a dose pack of prednisone, this may be elevated residually from the stair rates. I will advise her to follow closely with her primary care. Her rashes not typically consistent with a uric area. I've advised her of this and she can continue take the Benadryl but it will likely not alleviate the rash. We have also discussed topical antibiotics of mupirocin on the perioral rash. Advised her that with the formulation it may worsen the rash.  Evaluation does not show pathology that would require ongoing emergent intervention or inpatient treatment. Pt is hemodynamically stable and mentating appropriately. Discussed findings and plan with patient/guardian, who agrees with care plan. All questions answered. Return precautions discussed and outpatient follow up given.      Wynetta Emery, PA-C 03/14/15 2341  Wynetta Emery, PA-C 03/15/15 0004  Nelva Nay, MD 03/28/15 [phone]

## 2015-09-03 ENCOUNTER — Other Ambulatory Visit: Payer: Self-pay | Admitting: Gastroenterology

## 2015-09-03 DIAGNOSIS — K2100 Gastro-esophageal reflux disease with esophagitis, without bleeding: Secondary | ICD-10-CM

## 2015-09-08 ENCOUNTER — Ambulatory Visit
Admission: RE | Admit: 2015-09-08 | Discharge: 2015-09-08 | Disposition: A | Discharge status: Home / Self Care | Payer: No Typology Code available for payment source | Source: Ambulatory Visit | Attending: Gastroenterology | Admitting: Gastroenterology

## 2015-09-08 DIAGNOSIS — K2100 Gastro-esophageal reflux disease with esophagitis, without bleeding: Secondary | ICD-10-CM

## 2015-11-12 ENCOUNTER — Other Ambulatory Visit: Payer: Self-pay | Admitting: Gastroenterology

## 2015-11-13 ENCOUNTER — Emergency Department (HOSPITAL_BASED_OUTPATIENT_CLINIC_OR_DEPARTMENT_OTHER): Payer: Medicaid Other

## 2015-11-13 ENCOUNTER — Emergency Department (HOSPITAL_BASED_OUTPATIENT_CLINIC_OR_DEPARTMENT_OTHER)
Admission: EM | Admit: 2015-11-13 | Discharge: 2015-11-13 | Disposition: A | Discharge status: Home / Self Care | Payer: Medicaid Other | Attending: Emergency Medicine | Admitting: Emergency Medicine

## 2015-11-13 ENCOUNTER — Encounter (HOSPITAL_BASED_OUTPATIENT_CLINIC_OR_DEPARTMENT_OTHER): Payer: Self-pay

## 2015-11-13 DIAGNOSIS — Z3202 Encounter for pregnancy test, result negative: Secondary | ICD-10-CM | POA: Diagnosis not present

## 2015-11-13 DIAGNOSIS — E079 Disorder of thyroid, unspecified: Secondary | ICD-10-CM | POA: Insufficient documentation

## 2015-11-13 DIAGNOSIS — J45909 Unspecified asthma, uncomplicated: Secondary | ICD-10-CM | POA: Diagnosis not present

## 2015-11-13 DIAGNOSIS — M1990 Unspecified osteoarthritis, unspecified site: Secondary | ICD-10-CM | POA: Diagnosis not present

## 2015-11-13 DIAGNOSIS — Z791 Long term (current) use of non-steroidal anti-inflammatories (NSAID): Secondary | ICD-10-CM | POA: Diagnosis not present

## 2015-11-13 DIAGNOSIS — Z79899 Other long term (current) drug therapy: Secondary | ICD-10-CM | POA: Insufficient documentation

## 2015-11-13 DIAGNOSIS — G4700 Insomnia, unspecified: Secondary | ICD-10-CM | POA: Diagnosis not present

## 2015-11-13 DIAGNOSIS — R600 Localized edema: Secondary | ICD-10-CM | POA: Insufficient documentation

## 2015-11-13 DIAGNOSIS — F603 Borderline personality disorder: Secondary | ICD-10-CM | POA: Insufficient documentation

## 2015-11-13 DIAGNOSIS — F319 Bipolar disorder, unspecified: Secondary | ICD-10-CM | POA: Diagnosis not present

## 2015-11-13 DIAGNOSIS — Z9889 Other specified postprocedural states: Secondary | ICD-10-CM | POA: Insufficient documentation

## 2015-11-13 DIAGNOSIS — K219 Gastro-esophageal reflux disease without esophagitis: Secondary | ICD-10-CM | POA: Diagnosis not present

## 2015-11-13 DIAGNOSIS — N201 Calculus of ureter: Secondary | ICD-10-CM | POA: Insufficient documentation

## 2015-11-13 DIAGNOSIS — I10 Essential (primary) hypertension: Secondary | ICD-10-CM | POA: Diagnosis not present

## 2015-11-13 DIAGNOSIS — N390 Urinary tract infection, site not specified: Secondary | ICD-10-CM | POA: Insufficient documentation

## 2015-11-13 DIAGNOSIS — M797 Fibromyalgia: Secondary | ICD-10-CM | POA: Insufficient documentation

## 2015-11-13 DIAGNOSIS — Z8701 Personal history of pneumonia (recurrent): Secondary | ICD-10-CM | POA: Insufficient documentation

## 2015-11-13 DIAGNOSIS — R300 Dysuria: Secondary | ICD-10-CM | POA: Diagnosis present

## 2015-11-13 LAB — COMPREHENSIVE METABOLIC PANEL
ALBUMIN: 3.8 g/dL (ref 3.5–5.0)
ALK PHOS: 67 U/L (ref 38–126)
ALT: 33 U/L (ref 14–54)
ANION GAP: 6 (ref 5–15)
AST: 26 U/L (ref 15–41)
BILIRUBIN TOTAL: 0.4 mg/dL (ref 0.3–1.2)
BUN: 22 mg/dL — AB (ref 6–20)
CALCIUM: 8.9 mg/dL (ref 8.9–10.3)
CO2: 23 mmol/L (ref 22–32)
Chloride: 111 mmol/L (ref 101–111)
Creatinine, Ser: 1.11 mg/dL — ABNORMAL HIGH (ref 0.44–1.00)
GFR calc Af Amer: >60 mL/min (ref >60)
GFR calc non Af Amer: >60 mL/min (ref >60)
GLUCOSE: 106 mg/dL — AB (ref 65–99)
Potassium: 3.6 mmol/L (ref 3.5–5.1)
SODIUM: 140 mmol/L (ref 135–145)
TOTAL PROTEIN: 6.6 g/dL (ref 6.5–8.1)

## 2015-11-13 LAB — CBC WITH DIFFERENTIAL/PLATELET
BASOS ABS: 0.1 10*3/uL (ref 0.0–0.1)
BASOS PCT: 1 %
EOS PCT: 3 %
Eosinophils Absolute: 0.2 10*3/uL (ref 0.0–0.7)
HEMATOCRIT: 34.5 % — AB (ref 36.0–46.0)
Hemoglobin: 11 g/dL — ABNORMAL LOW (ref 12.0–15.0)
Lymphocytes Relative: 33 %
Lymphs Abs: 2.7 10*3/uL (ref 0.7–4.0)
MCH: 28.9 pg (ref 26.0–34.0)
MCHC: 31.9 g/dL (ref 30.0–36.0)
MCV: 90.8 fL (ref 78.0–100.0)
MONO ABS: 0.7 10*3/uL (ref 0.1–1.0)
MONOS PCT: 9 %
NEUTROS ABS: 4.6 10*3/uL (ref 1.7–7.7)
Neutrophils Relative %: 54 %
Platelets: 413 10*3/uL — ABNORMAL HIGH (ref 150–400)
RBC: 3.8 MIL/uL — ABNORMAL LOW (ref 3.87–5.11)
RDW: 12.9 % (ref 11.5–15.5)
WBC: 8.3 10*3/uL (ref 4.0–10.5)

## 2015-11-13 LAB — URINALYSIS, ROUTINE W REFLEX MICROSCOPIC
BILIRUBIN URINE: NEGATIVE
GLUCOSE, UA: NEGATIVE mg/dL
KETONES UR: 15 mg/dL — AB
NITRITE: POSITIVE — AB
PH: 5.5 (ref 5.0–8.0)
Protein, ur: NEGATIVE mg/dL
SPECIFIC GRAVITY, URINE: 1.023 (ref 1.005–1.030)

## 2015-11-13 LAB — URINE MICROSCOPIC-ADD ON

## 2015-11-13 LAB — PREGNANCY, URINE: Preg Test, Ur: NEGATIVE

## 2015-11-13 MED ORDER — HYDROCODONE-ACETAMINOPHEN 5-325 MG PO TABS
1.0000 | ORAL_TABLET | Freq: Once | ORAL | Status: AC
  Administered 2015-11-13: 1 via ORAL
  Filled 2015-11-13: qty 1

## 2015-11-13 MED ORDER — CEPHALEXIN 500 MG PO CAPS: 500.0000 mg | ORAL_CAPSULE | Freq: Two times a day (BID) | ORAL | Status: DC

## 2015-11-13 MED ORDER — ONDANSETRON 4 MG PO TBDP
4.0000 mg | ORAL_TABLET | Freq: Once | ORAL | Status: AC
  Administered 2015-11-13: 4 mg via ORAL
  Filled 2015-11-13: qty 1

## 2015-11-13 NOTE — ED Notes (Signed)
Pt tolerating PO fluids

## 2015-11-13 NOTE — ED Provider Notes (Signed)
CSN: [id number]     Arrival date & time 11/13/15  0156 History   First MD Initiated Contact with Patient 11/13/15 0200     Chief Complaint  Patient presents with  . Dysuria     (Consider location/radiation/quality/duration/timing/severity/associated sxs/prior Treatment) HPI Comments: 35 year old female with past medical history including bipolar disorder, fibromyalgia, borderline personality disorder, hypertension, GERD, asthma, kidney stones requiring stenting, cholecystectomy who presents with dysuria, nausea, and right flank pain. Patient states that yesterday she noticed some burning with urination. This dysuria has persisted throughout today and has been associated with a bladder pressure sensation. This evening she began having pain in her right side. She took a Tylenol 3 as well as tramadol with mild relief of her symptoms. Currently her pain is moderate in intensity and constant. She began having nausea within the last hour but denies any vomiting. She states her urine has been darker but she denies any hematuria. No fevers. Mild loose stools.  Patient is a 35 y.o. female presenting with dysuria. The history is provided by the patient.  Dysuria   Past Medical History  Diagnosis Date  . Fibromyalgia   . Bipolar 1 disorder (HCC)   . Depression   . Insomnia   . Borderline personality disorder   . Hypertension   . GERD (gastroesophageal reflux disease)   . Renal disorder     kidney stones with stent  . Asthma     exercised induced  . Pneumonia     "walking" pneumonia  . H/O hiatal hernia   . Headache(784.0)     treated with Topamax  . Arthritis   . Thyroid disease    Past Surgical History  Procedure Laterality Date  . Kidney stent  2007    placement and removal  . Foot surgery Left 1999    nodule removed  . Cholecystectomy    . Breast surgery Right     biopsy  . Lumbar laminectomy/decompression microdiscectomy Left 06/03/2013    Procedure: LEFT LUMBAR FIVE SACRAL ONE  LAMINECTOMY/DECOMPRESSION MICRODISCECTOMY ;  Surgeon: Lisbeth Renshaw, MD;  Location: [facility];  Service: Neurosurgery;  Laterality: Left;  LEFT L5S1 microdiskectomy   Family History  Problem Relation Age of Onset  . Hypertension Mother   . Bipolar disorder Mother   . Hypertension Father   . Depression Brother   . Alcohol abuse Brother   . Bipolar disorder Brother    Social History  Substance Use Topics  . Smoking status: Never Smoker   . Smokeless tobacco: Never Used  . Alcohol Use: Yes     Comment: socially   OB History    Gravida Para Term Preterm AB TAB SAB Ectopic Multiple Living       Review of Systems  Genitourinary: Positive for dysuria.   10 Systems reviewed and are negative for acute change except as noted in the HPI.    Allergies  Morphine and related and Other  Home Medications   Prior to Admission medications   Medication Sig Start Date End Date Taking? Authorizing Provider  citalopram (CELEXA) 20 MG tablet Take 20 mg by mouth daily.   Yes Historical Provider, MD  cyclobenzaprine (FLEXERIL) 5 MG tablet Take 5 mg by mouth 3 (three) times daily as needed for muscle spasms. 1/2 pill morning and lunch and 1 tablet at night   Yes Historical Provider, MD  diphenhydrAMINE (SOMINEX) 25 MG tablet Take 50 mg by mouth at bedtime  as needed for allergies or sleep.   Yes Historical Provider, MD  EPINEPHrine 0.3 mg/0.3 mL IJ SOAJ injection Inject 0.3 mg into the muscle once.   Yes Historical Provider, MD  gabapentin (NEURONTIN) 300 MG capsule Take 1,800 mg by mouth at bedtime. 6 pills at night   Yes Historical Provider, MD  gemfibrozil (LOPID) 600 MG tablet Take 600 mg by mouth 2 (two) times daily before a meal.   Yes Historical Provider, MD  hydrochlorothiazide (HYDRODIURIL) 25 MG tablet Take 25 mg by mouth daily as needed (itching).   Yes Historical Provider, MD  ibuprofen (ADVIL,MOTRIN) 800 MG tablet Take 800 mg by mouth every 8 (eight) hours as  needed.   Yes Historical Provider, MD  levothyroxine (SYNTHROID, LEVOTHROID) 50 MCG tablet Take 50 mcg by mouth daily before breakfast.   Yes Historical Provider, MD  Lysine HCl 1000 MG TABS Take 1,000 mg by mouth at bedtime.   Yes Historical Provider, MD  Melatonin 10 MG CAPS Take 10 mg by mouth at bedtime.   Yes Historical Provider, MD  metoCLOPramide (REGLAN) 10 MG tablet Take 10 mg by mouth daily.   Yes Historical Provider, MD  metoprolol succinate (TOPROL-XL) 25 MG 24 hr tablet Take 25 mg by mouth daily.   Yes Historical Provider, MD  ondansetron (ZOFRAN) 4 MG tablet Take 4 mg by mouth every 8 (eight) hours as needed for nausea or vomiting.   Yes Historical Provider, MD  polycarbophil (RA FIBER-TAB) 625 MG tablet Take 1,875 mg by mouth daily.   Yes Historical Provider, MD  topiramate (TOPAMAX) 50 MG tablet Take 100 mg by mouth at bedtime.   Yes Historical Provider, MD  traMADol (ULTRAM) 50 MG tablet Take 50 mg by mouth 2 (two) times daily.    Yes Historical Provider, MD  amitriptyline (ELAVIL) 50 MG tablet Take 1 tablet (50 mg total) by mouth at bedtime. Patient not taking: Reported on 11/12/2015 01/14/15   Willodean Rosenthal, MD  cephALEXin (KEFLEX) 500 MG capsule Take 1 capsule (500 mg total) by mouth 2 (two) times daily. 11/13/15   Laurence Spates, MD  dicyclomine (BENTYL) 20 MG tablet Take 1 tablet (20 mg total) by mouth 2 (two) times daily. Patient not taking: Reported on 11/12/2015 12/29/14   Junius Finner, PA-C  promethazine (PHENERGAN) 25 MG tablet Take 1 tablet (25 mg total) by mouth every 6 (six) hours as needed for nausea or vomiting. Patient not taking: Reported on 11/12/2015 12/29/14   Junius Finner, PA-C   BP 189/93 mmHg  Pulse 93  Temp(Src) 98.5 F (36.9 C) (Oral)  Resp 18  Ht 5\' 6"  (1.676 m)  Wt 310 lb (140.615 kg)  BMI 50.06 kg/m2  SpO2 98%  LMP 08/13/2015 Physical Exam  Constitutional: She is oriented to person, place, and time. She appears well-developed and  well-nourished. No distress.  comfortable  HENT:  Head: Normocephalic and atraumatic.  Mouth/Throat: Oropharynx is clear and moist.  Moist mucous membranes  Eyes: Conjunctivae are normal. Pupils are equal, round, and reactive to light.  Neck: Neck supple.  Cardiovascular: Normal rate, regular rhythm and normal heart sounds.   No murmur heard. Pulmonary/Chest: Effort normal and breath sounds normal.  Abdominal: Soft. Bowel sounds are normal. She exhibits no distension. There is no tenderness.  Genitourinary:  +R CVA tenderness  Musculoskeletal:  Trace BLE edema  Neurological: She is alert and oriented to person, place, and time.  Fluent speech  Skin: Skin is warm and dry.  Psychiatric: She has  a normal mood and affect. Judgment normal.  Nursing note and vitals reviewed.   ED Course  Procedures (including critical care time) Labs Review Labs Reviewed  COMPREHENSIVE METABOLIC PANEL - Abnormal; Notable for the following:    Glucose, Bld 106 (*)    BUN 22 (*)    Creatinine, Ser 1.11 (*)    All other components within normal limits  CBC WITH DIFFERENTIAL/PLATELET - Abnormal; Notable for the following:    RBC 3.80 (*)    Hemoglobin 11.0 (*)    HCT 34.5 (*)    Platelets 413 (*)    All other components within normal limits  URINALYSIS, ROUTINE W REFLEX MICROSCOPIC (NOT AT [facility]) - Abnormal; Notable for the following:    Color, Urine ORANGE (*)    Hgb urine dipstick MODERATE (*)    Ketones, ur 15 (*)    Nitrite POSITIVE (*)    Leukocytes, UA MODERATE (*)    All other components within normal limits  URINE MICROSCOPIC-ADD ON - Abnormal; Notable for the following:    Squamous Epithelial / LPF 0-5 (*)    Bacteria, UA MANY (*)    All other components within normal limits  URINE CULTURE  PREGNANCY, URINE    Imaging Review Ct Renal Stone Study  11/13/2015  CLINICAL DATA:  35 year old female with right flank pain and UTI. EXAM: CT ABDOMEN AND PELVIS WITHOUT CONTRAST TECHNIQUE:  Multidetector CT imaging of the abdomen and pelvis was performed following the standard protocol without IV contrast. COMPARISON:  None. FINDINGS: Evaluation of this exam is limited in the absence of intravenous contrast. The visualized lung bases are clear. No intra-abdominal free air or free fluid. Cholecystectomy. Diffuse hepatic steatosis. The pancreas, spleen, and adrenal glands appear unremarkable. Multiple small nonobstructing bilateral renal calculi noted measuring up to 4 mm in the interpolar aspect of the right kidney. There is no hydronephrosis on the left side. There is a 3 mm calculus along the posterior wall of the urinary bladder adjacent to the right UVJ which may represent a recently passed right renal stone versus a right UVJ calculus. There is mild right hydronephrosis. The uterus and the ovaries are grossly unremarkable. There is moderate stool throughout the colon. No evidence of bowel obstruction or inflammation. Normal appendix. The abdominal aorta and IVC are grossly unremarkable on this noncontrast study. No portal venous gas identified. There is no adenopathy. Small fat containing umbilical hernia. The osseous structures are grossly unremarkable. IMPRESSION: A 3 mm recently passed right renal calculus versus a right UVJ stone with mild right hydronephrosis. Small nonobstructing bilateral renal calculi. There is no hydronephrosis on the left. Fatty liver. No evidence of bowel obstruction or inflammation.  Normal appendix. Electronically Signed   By: Elgie Collard M.D.   On: 11/13/2015 03:37   I have personally reviewed and evaluated these lab results as part of my medical decision-making.   EKG Interpretation None     Medications  ondansetron (ZOFRAN-ODT) disintegrating tablet 4 mg (4 mg Oral Given 11/13/15 0229)  HYDROcodone-acetaminophen (NORCO/VICODIN) 5-325 MG per tablet 1 tablet (1 tablet Oral Given 11/13/15 0229)    MDM   Final diagnoses:  UTI (lower urinary tract  infection)  Right ureteral stone    Pt p/w 1 day of dysuria associated with bladder pressure, nausea, and R flank pain. She was well-appearing at presentation. Vital signs notable for hypertension at 189/93. Right CVA tenderness noted, no focal abdominal tenderness. Gave the patient Zofran and Norco and obtained above lab work  to evaluate kidney function as well as to rule out hematuria or evidence of UTI.  Creatinine 1.11 which is slightly elevated from the patient's baseline. UA with moderate leukocytes, nitrite positive, large amount of blood and many bacteria. Patient is not currently menstruating. I am concerned about evidence of infection as well as large amount of hematuria suggestive of stone. I discussed risks and benefits of obtaining CT scan to rule out obstructed, infected stone. Patient voiced understanding and elected to proceed with scan. CT showed a 3 mm recently passed right renal calculus versus right UVJ stone with mild hydronephrosis. Patient comfortable on reexamination and has been tolerating water here. Given her well appearance and adequate pain control, I am reassured that she has passed or is passing stone without problem. No fevers or vomiting and normal WBC are reassuring against systemic illness. I suspect R flank pain was related to passed stone however ddx includes pyelonephritis. I discussed risks and benefits of treating for UTI vs longer abx course for treatment of pyelo. Pt stated she would rather complete longer course as she sometimes has difficulty following up with her PCP. Provided with Keflex and discussed supportive care as well as return precautions including any signs of worsening infection. Patient voiced understanding and was discharged in satisfactory condition.  Laurence Spates, MD 11/13/15 [phone]

## 2015-11-13 NOTE — ED Notes (Signed)
Pt began having burning with urination last night, getting worse today with bladder pressure and continued pain with urination.  States this evening her right side started hurting and "feels like it is swollen."  States she took a tylenol 3 tonight and had mild relief with some nausea starting an hour ago.  Pt is sitting on bed without any apparent difficulty or issue.

## 2015-11-13 NOTE — ED Notes (Signed)
Pt verbalizes understanding of d/c instructions and denies any further needs at this time. 

## 2015-11-15 LAB — URINE CULTURE

## 2015-11-16 ENCOUNTER — Encounter (HOSPITAL_COMMUNITY)
Admission: RE | Disposition: A | Discharge status: Home / Self Care | Payer: Self-pay | Source: Ambulatory Visit | Attending: Gastroenterology

## 2015-11-16 ENCOUNTER — Ambulatory Visit (HOSPITAL_COMMUNITY)
Admission: RE | Admit: 2015-11-16 | Discharge: 2015-11-16 | Disposition: A | Discharge status: Home / Self Care | Payer: Medicaid Other | Source: Ambulatory Visit | Attending: Gastroenterology | Admitting: Gastroenterology

## 2015-11-16 DIAGNOSIS — E079 Disorder of thyroid, unspecified: Secondary | ICD-10-CM | POA: Insufficient documentation

## 2015-11-16 DIAGNOSIS — F603 Borderline personality disorder: Secondary | ICD-10-CM | POA: Insufficient documentation

## 2015-11-16 DIAGNOSIS — K219 Gastro-esophageal reflux disease without esophagitis: Secondary | ICD-10-CM | POA: Diagnosis present

## 2015-11-16 DIAGNOSIS — I10 Essential (primary) hypertension: Secondary | ICD-10-CM | POA: Diagnosis not present

## 2015-11-16 DIAGNOSIS — K2950 Unspecified chronic gastritis without bleeding: Secondary | ICD-10-CM | POA: Insufficient documentation

## 2015-11-16 DIAGNOSIS — K449 Diaphragmatic hernia without obstruction or gangrene: Secondary | ICD-10-CM | POA: Diagnosis not present

## 2015-11-16 DIAGNOSIS — M797 Fibromyalgia: Secondary | ICD-10-CM | POA: Diagnosis not present

## 2015-11-16 DIAGNOSIS — F319 Bipolar disorder, unspecified: Secondary | ICD-10-CM | POA: Diagnosis not present

## 2015-11-16 HISTORY — PX: ESOPHAGOGASTRODUODENOSCOPY: SHX5428

## 2015-11-16 SURGERY — EGD (ESOPHAGOGASTRODUODENOSCOPY)
Anesthesia: Moderate Sedation

## 2015-11-16 MED ORDER — SODIUM CHLORIDE 0.9 % IV SOLN
INTRAVENOUS | Status: DC
  Administered 2015-11-16: 500 mL via INTRAVENOUS

## 2015-11-16 MED ORDER — SODIUM CHLORIDE 0.9 % IV SOLN: INTRAVENOUS | Status: DC

## 2015-11-16 MED ORDER — FENTANYL CITRATE (PF) 100 MCG/2ML IJ SOLN
INTRAMUSCULAR | Status: DC | PRN
  Administered 2015-11-16 (×3): 25 ug via INTRAVENOUS

## 2015-11-16 MED ORDER — MIDAZOLAM HCL 10 MG/2ML IJ SOLN
INTRAMUSCULAR | Status: DC | PRN
  Administered 2015-11-16 (×2): 2 mg via INTRAVENOUS
  Administered 2015-11-16: 1 mg via INTRAVENOUS

## 2015-11-16 MED ORDER — MIDAZOLAM HCL 5 MG/ML IJ SOLN
INTRAMUSCULAR | Status: AC
  Filled 2015-11-16: qty 2

## 2015-11-16 MED ORDER — FENTANYL CITRATE (PF) 100 MCG/2ML IJ SOLN
INTRAMUSCULAR | Status: AC
  Filled 2015-11-16: qty 2

## 2015-11-16 MED ORDER — DIPHENHYDRAMINE HCL 50 MG/ML IJ SOLN
INTRAMUSCULAR | Status: DC | PRN
  Administered 2015-11-16: 25 mg via INTRAVENOUS

## 2015-11-16 MED ORDER — BUTAMBEN-TETRACAINE-BENZOCAINE 2-2-14 % EX AERO
INHALATION_SPRAY | CUTANEOUS | Status: DC | PRN
  Administered 2015-11-16: 1 via TOPICAL

## 2015-11-16 MED ORDER — DIPHENHYDRAMINE HCL 50 MG/ML IJ SOLN
INTRAMUSCULAR | Status: AC
  Filled 2015-11-16: qty 1

## 2015-11-16 NOTE — H&P (Signed)
[facility] Admission History & Physical  Chief Complaint: Refractory reflux HPI: Darlene Frazier is an 35 y.o. white female.  Who presents with gastroesophageal reflux refractory to medical management. Procedures to assess anatomy and rule out H. pylori in anticipation of possible antireflux surgery.  Past Medical History  Diagnosis Date  . Fibromyalgia   . Bipolar 1 disorder (HCC)   . Depression   . Insomnia   . Borderline personality disorder   . Hypertension   . GERD (gastroesophageal reflux disease)   . Renal disorder     kidney stones with stent  . Asthma     exercised induced  . Pneumonia     "walking" pneumonia  . H/O hiatal hernia   . Headache(784.0)     treated with Topamax  . Arthritis   . Thyroid disease     Past Surgical History  Procedure Laterality Date  . Kidney stent  2007    placement and removal  . Foot surgery Left 1999    nodule removed  . Cholecystectomy    . Breast surgery Right     biopsy  . Lumbar laminectomy/decompression microdiscectomy Left 06/03/2013    Procedure: LEFT LUMBAR FIVE SACRAL ONE LAMINECTOMY/DECOMPRESSION MICRODISCECTOMY ;  Surgeon: Lisbeth Renshaw, MD;  Location: [facility];  Service: Neurosurgery;  Laterality: Left;  LEFT L5S1 microdiskectomy    Medications Prior to Admission  Medication Sig Dispense Refill  . cephALEXin (KEFLEX) 500 MG capsule Take 1 capsule (500 mg total) by mouth 2 (two) times daily. 28 capsule 0  . citalopram (CELEXA) 20 MG tablet Take 20 mg by mouth daily.    . cyclobenzaprine (FLEXERIL) 5 MG tablet Take 5 mg by mouth 3 (three) times daily as needed for muscle spasms. 1/2 pill morning and lunch and 1 tablet at night    . diphenhydrAMINE (SOMINEX) 25 MG tablet Take 50 mg by mouth at bedtime as needed for allergies or sleep.    Marland Kitchen gabapentin (NEURONTIN) 300 MG capsule Take 1,800 mg by mouth at bedtime. 6 pills at night    . gemfibrozil (LOPID) 600 MG tablet Take 600 mg by mouth 2 (two) times  daily before a meal.    . ibuprofen (ADVIL,MOTRIN) 800 MG tablet Take 800 mg by mouth every 8 (eight) hours as needed.    Marland Kitchen levothyroxine (SYNTHROID, LEVOTHROID) 50 MCG tablet Take 50 mcg by mouth daily before breakfast.    . Lysine HCl 1000 MG TABS Take 1,000 mg by mouth at bedtime.    . Melatonin 10 MG CAPS Take 10 mg by mouth at bedtime.    . metoCLOPramide (REGLAN) 10 MG tablet Take 10 mg by mouth daily.    . metoprolol succinate (TOPROL-XL) 25 MG 24 hr tablet Take 25 mg by mouth daily.    . ondansetron (ZOFRAN) 4 MG tablet Take 4 mg by mouth every 8 (eight) hours as needed for nausea or vomiting.    . polycarbophil (RA FIBER-TAB) 625 MG tablet Take 1,875 mg by mouth daily.    Marland Kitchen topiramate (TOPAMAX) 50 MG tablet Take 100 mg by mouth at bedtime.    . traMADol (ULTRAM) 50 MG tablet Take 50 mg by mouth 2 (two) times daily.     Marland Kitchen amitriptyline (ELAVIL) 50 MG tablet Take 1 tablet (50 mg total) by mouth at bedtime. (Patient not taking: Reported on 11/12/2015) 30 tablet 6  . dicyclomine (BENTYL) 20 MG tablet Take 1 tablet (20 mg total) by mouth 2 (two) times daily. (  Patient not taking: Reported on 11/12/2015) 20 tablet 0  . EPINEPHrine 0.3 mg/0.3 mL IJ SOAJ injection Inject 0.3 mg into the muscle once.    . hydrochlorothiazide (HYDRODIURIL) 25 MG tablet Take 25 mg by mouth daily as needed (itching).    . promethazine (PHENERGAN) 25 MG tablet Take 1 tablet (25 mg total) by mouth every 6 (six) hours as needed for nausea or vomiting. (Patient not taking: Reported on 11/12/2015) 10 tablet 0    Allergies:  Allergies  Allergen Reactions  . Morphine And Related Other (See Comments)    Severe migraines  . Other     Grass and hay    Family History  Problem Relation Age of Onset  . Hypertension Mother   . Bipolar disorder Mother   . Hypertension Father   . Depression Brother   . Alcohol abuse Brother   . Bipolar disorder Brother     Social History:  reports that she has never smoked. She has  never used smokeless tobacco. She reports that she drinks alcohol. She reports that she does not use illicit drugs.  Review of Systems: negative except as above   Blood pressure 136/77, pulse 102, temperature 98.6 F (37 C), temperature source Oral, resp. rate 12, height  (1.676 m), weight 140.615 kg (310 lb), last menstrual period 07/16/2015, SpO2 95 %. Head: Normocephalic, without obvious abnormality, atraumatic Neck: no adenopathy, no carotid bruit, no JVD, supple, symmetrical, trachea midline and thyroid not enlarged, symmetric, no tenderness/mass/nodules Resp: clear to auscultation bilaterally Cardio: regular rate and rhythm, S1, S2 normal, no murmur, click, rub or gallop GI: Abdomen soft nondistended with normoactive bowel sounds. no megaly masses or guarding Extremities: extremities normal, atraumatic, no cyanosis or edema  No results found for this or any previous visit (from the past 48 hour(s)). No results found.  Assessment: Refractory reflux, unresponsive to PPI therapy. Plan: Will proceed with EGD.  Everest Brod C 11/16/2015, 9:15 AM

## 2015-11-16 NOTE — Op Note (Signed)
[facility] [address] [place], [address]   ENDOSCOPY PROCEDURE REPORT  PATIENT: Darlene, Frazier  MR#: [id number] BIRTHDATE: 1981-06-05 , 35  yrs. old GENDER: female ENDOSCOPIST: Dorena Cookey, MD REFERRED BY: PROCEDURE DATE:  12-14-2015 PROCEDURE: ASA CLASS: INDICATIONS:  refractory reflux MEDICATIONS: fentanyl 75 g, Versed 5 mg, Benadryl 25 mg TOPICAL ANESTHETIC:  DESCRIPTION OF PROCEDURE: After the risks benefits and alternatives of the procedure were thoroughly explained, informed consent was obtained.  The [employer] G[employer]OR[employer]COP[employer] [id number] endoscope was introduced through the mouth and advanced to the second portion of the duodenum , Without limitations.  The instrument was slowly withdrawn as the mucosa was fully examined. Estimated blood loss is zero unless otherwise noted in this procedure report.    GE junction essentially normal, possible small hiatal hernia retroflexion normal Stomach: Multiple shallow erosions with white exudate the largest up to 4 5 mm in the antrum  . Biopsies taken. Duodenum was normal The scope was then withdrawn from the patient and the procedure completed.  COMPLICATIONS: There were no immediate complications.  ENDOSCOPIC IMPRESSION: moderately severe gastritis with multiple discrete erosions. Very small hiatal hernia  RECOMMENDATIONS: review NSAID history, continue PPI, and await biopsies to rule out H. pylori.  REPEAT EXAM:  eSigned:  Dorena Cookey, MD 2015/12/14 9:43 AM    CC:  CPT CODES: ICD CODES:  The ICD and CPT codes recommended by this software are interpretations from the data that the clinical staff has captured with the software.  The verification of the translation of this report to the ICD and CPT codes and modifiers is the sole responsibility of the health care institution and practicing physician where this report was generated.  [employer] will not be held  responsible for the validity of the ICD and CPT codes included on this report.  AMA assumes no liability for data contained or not contained herein. CPT is a Publishing rights manager of the [employer].  PATIENT NAME:  Darlene Frazier, Darlene Frazier MR#: [id number]

## 2015-11-16 NOTE — Addendum Note (Signed)
Addended by: Dorena Cookey on: 11/16/2015 09:12 AM   Modules accepted: Orders

## 2015-11-17 ENCOUNTER — Encounter (HOSPITAL_COMMUNITY): Payer: Self-pay | Admitting: Gastroenterology

## 2016-03-07 ENCOUNTER — Emergency Department (HOSPITAL_BASED_OUTPATIENT_CLINIC_OR_DEPARTMENT_OTHER)
Admission: EM | Admit: 2016-03-07 | Discharge: 2016-03-07 | Disposition: A | Discharge status: Home / Self Care | Payer: Medicare Other | Attending: Emergency Medicine | Admitting: Emergency Medicine

## 2016-03-07 DIAGNOSIS — Z79899 Other long term (current) drug therapy: Secondary | ICD-10-CM | POA: Diagnosis not present

## 2016-03-07 DIAGNOSIS — J45909 Unspecified asthma, uncomplicated: Secondary | ICD-10-CM | POA: Insufficient documentation

## 2016-03-07 DIAGNOSIS — E079 Disorder of thyroid, unspecified: Secondary | ICD-10-CM | POA: Insufficient documentation

## 2016-03-07 DIAGNOSIS — M5442 Lumbago with sciatica, left side: Secondary | ICD-10-CM | POA: Diagnosis not present

## 2016-03-07 DIAGNOSIS — M5416 Radiculopathy, lumbar region: Secondary | ICD-10-CM | POA: Diagnosis not present

## 2016-03-07 DIAGNOSIS — I10 Essential (primary) hypertension: Secondary | ICD-10-CM | POA: Insufficient documentation

## 2016-03-07 DIAGNOSIS — F329 Major depressive disorder, single episode, unspecified: Secondary | ICD-10-CM | POA: Insufficient documentation

## 2016-03-07 DIAGNOSIS — M5432 Sciatica, left side: Secondary | ICD-10-CM

## 2016-03-07 DIAGNOSIS — M545 Low back pain: Secondary | ICD-10-CM | POA: Diagnosis present

## 2016-03-07 DIAGNOSIS — M1990 Unspecified osteoarthritis, unspecified site: Secondary | ICD-10-CM | POA: Insufficient documentation

## 2016-03-07 NOTE — ED Provider Notes (Signed)
CSN: [id number]     Arrival date & time 03/07/16  1714 History  By signing my name below, I, Arianna Nassar, attest that this documentation has been prepared under the direction and in the presence of Doug SouSam Gianlucca Szymborski, MD. Electronically Signed: Octavia HeirArianna Nassar, ED Scribe. 03/07/2016. 6:13 PM.    Chief Complaint  Patient presents with  . Back Pain      The history is provided by the patient. No language interpreter was used.   HPI Comments: Darlene Frazier is a 35 y.o. female who has a PMhx of fibromyalgia, bipolar 1 disorder, depression, insomnia, BPD, HTN, GERD, renal disorder, asthma, pneumonia, headache, arthritis, and thyroid disease presents to the Emergency Department complaining of constant, gradual worsening, moderate, Left lower back pain that radiates into her left foot onset one month ago. Pt reports having a herniated disc in the past and notes that her pain feels very similar. She notes a past surgical hx of lumbar laminectomy/decompression microdiscectomy 3 years ago. Pt has taken flexeril and tramadol to alleviate her pain with relief. Her last menstrual cycle was one year ago due to hyperthyroidism. She denies fever, bowel/bladder incontinence, no illicit drug use ETOH use. Pt is a non-smoker. Dr. Randa EvensEdwards at [facility] is her PCP.  Past Medical History  Diagnosis Date  . Fibromyalgia   . Bipolar 1 disorder (HCC)   . Depression   . Insomnia   . Borderline personality disorder   . Hypertension   . GERD (gastroesophageal reflux disease)   . Renal disorder     kidney stones with stent  . Asthma     exercised induced  . Pneumonia     "walking" pneumonia  . H/O hiatal hernia   . Headache(784.0)     treated with Topamax  . Arthritis   . Thyroid disease    Past Surgical History  Procedure Laterality Date  . Kidney stent  2007    placement and removal  . Foot surgery Left 1999    nodule removed  . Cholecystectomy    . Breast surgery Right      biopsy  . Lumbar laminectomy/decompression microdiscectomy Left 06/03/2013    Procedure: LEFT LUMBAR FIVE SACRAL ONE LAMINECTOMY/DECOMPRESSION MICRODISCECTOMY ;  Surgeon: Lisbeth RenshawNeelesh Nundkumar, MD;  Location: [facility];  Service: Neurosurgery;  Laterality: Left;  LEFT L5S1 microdiskectomy  . Esophagogastroduodenoscopy N/A 11/16/2015    Procedure: ESOPHAGOGASTRODUODENOSCOPY (EGD);  Surgeon: Dorena CookeyJohn Hayes, MD;  [facility];  Service: Endoscopy;  Laterality: N/A;   Family History  Problem Relation Age of Onset  . Hypertension Mother   . Bipolar disorder Mother   . Hypertension Father   . Depression Brother   . Alcohol abuse Brother   . Bipolar disorder Brother    Social History  Substance Use Topics  . Smoking status: Never Smoker   . Smokeless tobacco: Never Used  . Alcohol Use: Yes     Comment: socially   OB History    Gravida Para Term Preterm AB TAB SAB Ectopic Multiple Living   0 0 0 0 0 0 0 0 0 0      Review of Systems  Constitutional: Negative.   HENT: Negative.   Respiratory: Negative.   Cardiovascular: Negative.   Gastrointestinal: Negative.   Musculoskeletal: Positive for back pain and gait problem.       Walks with cane due to chronic limp of left leg  Skin: Negative.   Neurological: Negative.   Psychiatric/Behavioral: Negative.   All  other systems reviewed and are negative.     Allergies  Morphine and related and Other  Home Medications   Prior to Admission medications   Medication Sig Start Date End Date Taking? Authorizing Provider  cephALEXin (KEFLEX) 500 MG capsule Take 1 capsule (500 mg total) by mouth 2 (two) times daily. 11/13/15   Laurence Spates, MD  cyclobenzaprine (FLEXERIL) 5 MG tablet Take 5 mg by mouth 3 (three) times daily as needed for muscle spasms. 1/2 pill morning and lunch and 1 tablet at night    Historical Provider, MD  EPINEPHrine 0.3 mg/0.3 mL IJ SOAJ injection Inject 0.3 mg into the muscle once.    Historical Provider, MD   gabapentin (NEURONTIN) 300 MG capsule Take 1,800 mg by mouth at bedtime. 6 pills at night    Historical Provider, MD  gemfibrozil (LOPID) 600 MG tablet Take 600 mg by mouth 2 (two) times daily before a meal.    Historical Provider, MD  hydrochlorothiazide (HYDRODIURIL) 25 MG tablet Take 25 mg by mouth daily as needed (itching).    Historical Provider, MD  levothyroxine (SYNTHROID, LEVOTHROID) 50 MCG tablet Take 50 mcg by mouth daily before breakfast.    Historical Provider, MD  Lysine HCl 1000 MG TABS Take 1,000 mg by mouth at bedtime.    Historical Provider, MD  Melatonin 10 MG CAPS Take 10 mg by mouth at bedtime.    Historical Provider, MD  metoCLOPramide (REGLAN) 10 MG tablet Take 10 mg by mouth daily.    Historical Provider, MD  metoprolol succinate (TOPROL-XL) 25 MG 24 hr tablet Take 25 mg by mouth daily.    Historical Provider, MD  ondansetron (ZOFRAN) 4 MG tablet Take 4 mg by mouth every 8 (eight) hours as needed for nausea or vomiting.    Historical Provider, MD  polycarbophil (RA FIBER-TAB) 625 MG tablet Take 1,875 mg by mouth daily.    Historical Provider, MD  topiramate (TOPAMAX) 50 MG tablet Take 100 mg by mouth at bedtime.    Historical Provider, MD  traMADol (ULTRAM) 50 MG tablet Take 50 mg by mouth 2 (two) times daily.     Historical Provider, MD   Triage vitals: BP 137/96 mmHg  Pulse 117  Temp(Src) 98.5 F (36.9 C) (Oral)  Resp 18  Ht  (1.676 m)  Wt 305 lb (138.347 kg)  BMI 49.25 kg/m2  SpO2 97% Physical Exam  Constitutional: She appears well-developed and well-nourished.  HENT:  Head: Normocephalic and atraumatic.  Eyes: Conjunctivae are normal. Pupils are equal, round, and reactive to light.  Neck: Neck supple. No tracheal deviation present. No thyromegaly present.  Cardiovascular: Regular rhythm.   No murmur heard. Mildly tachycardic. Pulse counted at 112 by me  Pulmonary/Chest: Effort normal and breath sounds normal.  Abdominal: Soft. Bowel sounds are  normal. She exhibits no distension. There is no tenderness.  Morbidly obese  Musculoskeletal: Normal range of motion. She exhibits no edema or tenderness.  Left-sided paralumbar tenderness.  Neurological: She is alert. She displays normal reflexes. Coordination normal.  Walks with mild limp favoring left lower extremity. DTRs symmetric bilaterally at knee jerk ankle jerk and biceps toes downward going bilaterally  Skin: Skin is warm and dry. No rash noted.  Psychiatric: She has a normal mood and affect.  Nursing note and vitals reviewed.   ED Course  Procedures  DIAGNOSTIC STUDIES: Oxygen Saturation is 97% on RA, normal by my interpretation.  COORDINATION OF CARE:  6:11 PM Discussed treatment plan with  pt at bedside and pt agreed to plan. Pt was encouraged to follow up with her PCP for further evaluation.   Labs Review Labs Reviewed - No data to display  Imaging Review No results found. I have personally reviewed and evaluated these images and lab results as part of my medical decision-making.   EKG Interpretation None      MDM  No need for emergent imaging. I discussed with patient that if she wishes further evaluation or evaluation for surgery she should go through her primary care physician. Pain is chronic. Continue tramadol as prescribed Diagnosis chronic left-sided lumbar radiculopathy Final diagnoses:  None     I personally performed the services described in this documentation, which was scribed in my presence. The recorded information has been reviewed and considered.    Doug Sou, MD 03/07/16 1610

## 2016-03-07 NOTE — Discharge Instructions (Signed)
Call your primary care physician for referral to specialist or for further testing if you wish to have further evaluation of your pain. It is safe to continue to take tramadol as prescribed.

## 2016-03-07 NOTE — ED Notes (Signed)
Patient undressed and placed into a gown opening in the back. Husband at bedside.

## 2016-03-07 NOTE — ED Notes (Signed)
Back pain x 1 month getting worse

## 2016-08-15 ENCOUNTER — Other Ambulatory Visit: Payer: Self-pay | Admitting: Neurosurgery

## 2016-10-05 ENCOUNTER — Encounter (HOSPITAL_COMMUNITY): Payer: Self-pay

## 2016-10-05 ENCOUNTER — Encounter (HOSPITAL_COMMUNITY)
Admission: RE | Admit: 2016-10-05 | Discharge: 2016-10-05 | Disposition: A | Discharge status: Home / Self Care | Payer: Medicare Other | Source: Ambulatory Visit | Attending: Neurosurgery | Admitting: Neurosurgery

## 2016-10-05 DIAGNOSIS — R000 Tachycardia, unspecified: Secondary | ICD-10-CM | POA: Insufficient documentation

## 2016-10-05 DIAGNOSIS — M797 Fibromyalgia: Secondary | ICD-10-CM

## 2016-10-05 DIAGNOSIS — K219 Gastro-esophageal reflux disease without esophagitis: Secondary | ICD-10-CM

## 2016-10-05 DIAGNOSIS — I10 Essential (primary) hypertension: Secondary | ICD-10-CM | POA: Insufficient documentation

## 2016-10-05 DIAGNOSIS — Z01810 Encounter for preprocedural cardiovascular examination: Secondary | ICD-10-CM

## 2016-10-05 DIAGNOSIS — Z79899 Other long term (current) drug therapy: Secondary | ICD-10-CM

## 2016-10-05 DIAGNOSIS — E079 Disorder of thyroid, unspecified: Secondary | ICD-10-CM | POA: Insufficient documentation

## 2016-10-05 DIAGNOSIS — Z01818 Encounter for other preprocedural examination: Secondary | ICD-10-CM | POA: Insufficient documentation

## 2016-10-05 DIAGNOSIS — M545 Low back pain: Secondary | ICD-10-CM | POA: Insufficient documentation

## 2016-10-05 HISTORY — DX: Tachycardia, unspecified: R00.0

## 2016-10-05 HISTORY — DX: Snoring: R06.83

## 2016-10-05 HISTORY — DX: Hypothyroidism, unspecified: E03.9

## 2016-10-05 LAB — SURGICAL PCR SCREEN
MRSA, PCR: NEGATIVE
Staphylococcus aureus: NEGATIVE

## 2016-10-05 LAB — CBC
HEMATOCRIT: 37.5 % (ref 36.0–46.0)
HEMOGLOBIN: 12.6 g/dL (ref 12.0–15.0)
MCH: 30.9 pg (ref 26.0–34.0)
MCHC: 33.6 g/dL (ref 30.0–36.0)
MCV: 91.9 fL (ref 78.0–100.0)
Platelets: 442 10*3/uL — ABNORMAL HIGH (ref 150–400)
RBC: 4.08 MIL/uL (ref 3.87–5.11)
RDW: 12.9 % (ref 11.5–15.5)
WBC: 5.6 10*3/uL (ref 4.0–10.5)

## 2016-10-05 LAB — ABO/RH: ABO/RH(D): A POS

## 2016-10-05 LAB — BASIC METABOLIC PANEL
Anion gap: 6 (ref 5–15)
BUN: 13 mg/dL (ref 6–20)
CHLORIDE: 110 mmol/L (ref 101–111)
CO2: 23 mmol/L (ref 22–32)
Calcium: 9.3 mg/dL (ref 8.9–10.3)
Creatinine, Ser: 1.03 mg/dL — ABNORMAL HIGH (ref 0.44–1.00)
Glucose, Bld: 108 mg/dL — ABNORMAL HIGH (ref 65–99)
POTASSIUM: 3.8 mmol/L (ref 3.5–5.1)
SODIUM: 139 mmol/L (ref 135–145)

## 2016-10-05 LAB — HCG, SERUM, QUALITATIVE: PREG SERUM: NEGATIVE

## 2016-10-05 LAB — TYPE AND SCREEN
ABO/RH(D): A POS
ANTIBODY SCREEN: NEGATIVE

## 2016-10-05 MED ORDER — DEXTROSE 5 % IV SOLN
3.0000 g | INTRAVENOUS | Status: AC
  Administered 2016-10-06: 3 g via INTRAVENOUS
  Filled 2016-10-05: qty 3000

## 2016-10-05 MED ORDER — CHLORHEXIDINE GLUCONATE CLOTH 2 % EX PADS: 6.0000 | MEDICATED_PAD | Freq: Once | CUTANEOUS | Status: DC

## 2016-10-05 NOTE — Progress Notes (Signed)
Anesthesia Chart Review:  Pt is a 36 year old female scheduled for L5-S1 PLIF on 10/06/2016 with Darlene RenshawNeelesh Nundkumar, MD.   PCP is Darlene HartsElizabeth Todd, NP (notes in care everywhere).   PMH includes:  HTN, tachycardia, hypothyroidism, bipolar disorder, borderline personality disorder. Never smoker. BMI 47. S/p lumbar laminectomy 06/03/13.   BP 104/82   Pulse (!) 110   Temp 36.8 C   Resp 20   Ht 5\' 6"  (1.676 m)   Wt 291 lb 12.8 oz (132.4 kg)   LMP  (Approximate) Comment: over a year d/t thyroid medicine  SpO2 97%   BMI 47.10 kg/m    Medications include: dexilant, gemfibrozil, levothyroxine, latuda, metoprolol, zantac  Preoperative labs pending.   EKG 10/05/16: Sinus tachycardia (106 bpm) with PVCs or Fusion complexes.   Pt tachycardiac at PAT. She reported to PAT RN that she is always tachycardic in 100's, and her PCP is aware and follows it. VS documented by PCP and in our Epic records show pt is typically mildly tachycardic.  If no changes, I anticipate pt can proceed with surgery as scheduled.   Rica Mastngela Trey Gulbranson, FNP-BC [facility]Anesthesiology Phone: [phone] 10/05/2016 4:18 PM

## 2016-10-05 NOTE — Progress Notes (Signed)
PCP: Deneen HartsElizabeth Todd, MD Pt denies cardiac hx or cardiologist.  Pt states she always has a high HR, pulse 110 taken manually. Pt denies symptoms. Anesthesia notified of HR.   No c/o chest pain, SOB, signs of infection per pt.   EKG: 10/05/16

## 2016-10-05 NOTE — Pre-Procedure Instructions (Addendum)
Elberta L Bashor  10/05/2016      [place] [employer] [place], [place] [place] [address] Phone: [phone] Fax: [phone]  [facility] [address] [place], [place] [address] [place] [place] [address] Phone: [phone] Fax: [phone]    Your procedure is scheduled on Friday January 5.  [facility] at 5:45 A.M.  Call this number if you have problems the morning of surgery:  [phone]   Remember:  Do not eat food or drink liquids after midnight.  Take these medicines the morning of surgery with A SIP OF WATER: duloxetine (cymbalta), levothyroxine (synthroid), metoprolol (Toprol-XL), ondansetron (zofran) if needed, hydrocodone (Norco) if needed  STOP taking any Aspirin, Aleve, Naproxen, Ibuprofen, Motrin, Advil, Goody's, BC's, all herbal medications, fish oil, and all vitamins    Do not wear jewelry, make-up or nail polish.  Do not wear lotions, powders, or perfumes, or deoderant.  Do not shave 48 hours prior to surgery.  Men may shave face and neck.  Do not bring valuables to the hospital.  [facility] is not responsible for any belongings or valuables.  Contacts, dentures or bridgework may not be worn into surgery.  Leave your suitcase in the car.  After surgery it may be brought to your room.  For patients admitted to the hospital, discharge time will be determined by your treatment team.  Patients discharged the day of surgery will not be allowed to drive home.   Special instructions:    [place]- Preparing For Surgery  Before surgery, you can play an important role. Because skin is not sterile, your skin needs to be as free of germs as possible. You can reduce the number of germs on your skin by washing with CHG (chlorahexidine gluconate) Soap before surgery.  CHG is an antiseptic cleaner which kills germs and bonds with the skin to continue  killing germs even after washing.  Please do not use if you have an allergy to CHG or antibacterial soaps. If your skin becomes reddened/irritated stop using the CHG.  Do not shave (including legs and underarms) for at least 48 hours prior to first CHG shower. It is OK to shave your face.  Please follow these instructions carefully.   1. Shower the NIGHT BEFORE SURGERY and the MORNING OF SURGERY with CHG.   2. If you chose to wash your hair, wash your hair first as usual with your normal shampoo.  3. After you shampoo, rinse your hair and body thoroughly to remove the shampoo.  4. Use CHG as you would any other liquid soap. You can apply CHG directly to the skin and wash gently with a scrungie or a clean washcloth.   5. Apply the CHG Soap to your body ONLY FROM THE NECK DOWN.  Do not use on open wounds or open sores. Avoid contact with your eyes, ears, mouth and genitals (private parts). Wash genitals (private parts) with your normal soap.  6. Wash thoroughly, paying special attention to the area where your surgery will be performed.  7. Thoroughly rinse your body with warm water from the neck down.  8. DO NOT shower/wash with your normal soap after using and rinsing off the CHG Soap.  9. Pat yourself dry with a CLEAN TOWEL.   10. Wear CLEAN PAJAMAS   11. Place CLEAN SHEETS on your bed the night of  your first shower and DO NOT SLEEP WITH PETS.    Day of Surgery: Do not apply any deodorants/lotions. Please wear clean clothes to the hospital/surgery center.      Please read over the following fact sheets that you were given. MRSA Information

## 2016-10-06 ENCOUNTER — Inpatient Hospital Stay (HOSPITAL_COMMUNITY): Payer: Medicare Other

## 2016-10-06 ENCOUNTER — Inpatient Hospital Stay (HOSPITAL_COMMUNITY)
Admission: RE | Admit: 2016-10-06 | Discharge: 2016-10-10 | DRG: 460 | Disposition: A | Discharge status: Home / Self Care | Payer: Medicare Other | Source: Ambulatory Visit | Attending: Neurosurgery | Admitting: Neurosurgery

## 2016-10-06 ENCOUNTER — Inpatient Hospital Stay (HOSPITAL_COMMUNITY): Payer: Medicare Other | Admitting: Anesthesiology

## 2016-10-06 ENCOUNTER — Inpatient Hospital Stay (HOSPITAL_COMMUNITY): Payer: Medicare Other | Admitting: Vascular Surgery

## 2016-10-06 ENCOUNTER — Encounter (HOSPITAL_COMMUNITY)
Admission: RE | Disposition: A | Discharge status: Home / Self Care | Payer: Self-pay | Source: Ambulatory Visit | Attending: Neurosurgery

## 2016-10-06 DIAGNOSIS — K219 Gastro-esophageal reflux disease without esophagitis: Secondary | ICD-10-CM | POA: Diagnosis present

## 2016-10-06 DIAGNOSIS — F603 Borderline personality disorder: Secondary | ICD-10-CM | POA: Diagnosis present

## 2016-10-06 DIAGNOSIS — M797 Fibromyalgia: Secondary | ICD-10-CM | POA: Diagnosis present

## 2016-10-06 DIAGNOSIS — M961 Postlaminectomy syndrome, not elsewhere classified: Secondary | ICD-10-CM | POA: Diagnosis present

## 2016-10-06 DIAGNOSIS — Z79899 Other long term (current) drug therapy: Secondary | ICD-10-CM

## 2016-10-06 DIAGNOSIS — M545 Low back pain: Secondary | ICD-10-CM | POA: Diagnosis present

## 2016-10-06 DIAGNOSIS — G4700 Insomnia, unspecified: Secondary | ICD-10-CM | POA: Diagnosis present

## 2016-10-06 DIAGNOSIS — M5416 Radiculopathy, lumbar region: Secondary | ICD-10-CM | POA: Diagnosis present

## 2016-10-06 DIAGNOSIS — I10 Essential (primary) hypertension: Secondary | ICD-10-CM | POA: Diagnosis present

## 2016-10-06 DIAGNOSIS — R2689 Other abnormalities of gait and mobility: Secondary | ICD-10-CM

## 2016-10-06 DIAGNOSIS — E039 Hypothyroidism, unspecified: Secondary | ICD-10-CM | POA: Diagnosis present

## 2016-10-06 DIAGNOSIS — F319 Bipolar disorder, unspecified: Secondary | ICD-10-CM | POA: Diagnosis present

## 2016-10-06 DIAGNOSIS — Z419 Encounter for procedure for purposes other than remedying health state, unspecified: Secondary | ICD-10-CM

## 2016-10-06 DIAGNOSIS — J45909 Unspecified asthma, uncomplicated: Secondary | ICD-10-CM | POA: Diagnosis present

## 2016-10-06 DIAGNOSIS — Z981 Arthrodesis status: Secondary | ICD-10-CM

## 2016-10-06 HISTORY — PX: POSTERIOR LUMBAR FUSION: SHX6036

## 2016-10-06 SURGERY — POSTERIOR LUMBAR FUSION 1 LEVEL
Anesthesia: General

## 2016-10-06 MED ORDER — FENTANYL CITRATE (PF) 100 MCG/2ML IJ SOLN
INTRAMUSCULAR | Status: DC | PRN
  Administered 2016-10-06 (×4): 50 ug via INTRAVENOUS
  Administered 2016-10-06: 100 ug via INTRAVENOUS
  Administered 2016-10-06 (×2): 50 ug via INTRAVENOUS

## 2016-10-06 MED ORDER — OXYCODONE-ACETAMINOPHEN 5-325 MG PO TABS
ORAL_TABLET | ORAL | Status: AC
  Administered 2016-10-06: 2 via ORAL
  Filled 2016-10-06: qty 2

## 2016-10-06 MED ORDER — CEFAZOLIN SODIUM-DEXTROSE 2-4 GM/100ML-% IV SOLN
2.0000 g | Freq: Three times a day (TID) | INTRAVENOUS | Status: AC
  Administered 2016-10-06 – 2016-10-07 (×2): 2 g via INTRAVENOUS
  Filled 2016-10-06 (×3): qty 100

## 2016-10-06 MED ORDER — HYDROMORPHONE HCL 1 MG/ML IJ SOLN
0.2500 mg | INTRAMUSCULAR | Status: DC | PRN
  Administered 2016-10-06 (×4): 0.5 mg via INTRAVENOUS

## 2016-10-06 MED ORDER — DOXYLAMINE SUCCINATE (SLEEP) 25 MG PO TABS: 75.0000 mg | ORAL_TABLET | Freq: Every day | ORAL | Status: DC

## 2016-10-06 MED ORDER — THROMBIN 20000 UNITS EX SOLR
CUTANEOUS | Status: DC | PRN
  Administered 2016-10-06: 09:00:00 via TOPICAL

## 2016-10-06 MED ORDER — MIDAZOLAM HCL 2 MG/2ML IJ SOLN
INTRAMUSCULAR | Status: AC
  Filled 2016-10-06: qty 2

## 2016-10-06 MED ORDER — SUGAMMADEX SODIUM 500 MG/5ML IV SOLN
INTRAVENOUS | Status: AC
  Filled 2016-10-06: qty 5

## 2016-10-06 MED ORDER — SODIUM CHLORIDE 0.9% FLUSH: 3.0000 mL | INTRAVENOUS | Status: DC | PRN

## 2016-10-06 MED ORDER — ACETAMINOPHEN 650 MG RE SUPP: 650.0000 mg | RECTAL | Status: DC | PRN

## 2016-10-06 MED ORDER — FENTANYL CITRATE (PF) 100 MCG/2ML IJ SOLN
INTRAMUSCULAR | Status: AC
  Filled 2016-10-06: qty 2

## 2016-10-06 MED ORDER — OXYCODONE-ACETAMINOPHEN 5-325 MG PO TABS
1.0000 | ORAL_TABLET | ORAL | Status: DC | PRN
  Administered 2016-10-06 – 2016-10-10 (×13): 2 via ORAL
  Filled 2016-10-06 (×12): qty 2

## 2016-10-06 MED ORDER — SODIUM CHLORIDE 0.9% FLUSH
3.0000 mL | Freq: Two times a day (BID) | INTRAVENOUS | Status: DC
  Administered 2016-10-06: 22:00:00 via INTRAVENOUS
  Administered 2016-10-07 – 2016-10-08 (×3): 3 mL via INTRAVENOUS

## 2016-10-06 MED ORDER — GEMFIBROZIL 600 MG PO TABS
600.0000 mg | ORAL_TABLET | Freq: Every evening | ORAL | Status: DC
  Administered 2016-10-06 – 2016-10-09 (×4): 600 mg via ORAL
  Filled 2016-10-06 (×5): qty 1

## 2016-10-06 MED ORDER — METHYLPREDNISOLONE ACETATE 80 MG/ML IJ SUSP
INTRAMUSCULAR | Status: AC
  Filled 2016-10-06: qty 1

## 2016-10-06 MED ORDER — SIMETHICONE 80 MG PO CHEW: 80.0000 mg | CHEWABLE_TABLET | Freq: Every day | ORAL | Status: DC | PRN

## 2016-10-06 MED ORDER — METHOCARBAMOL 500 MG PO TABS
500.0000 mg | ORAL_TABLET | Freq: Four times a day (QID) | ORAL | Status: DC | PRN
  Administered 2016-10-06 – 2016-10-10 (×4): 500 mg via ORAL
  Filled 2016-10-06 (×5): qty 1

## 2016-10-06 MED ORDER — MENTHOL 3 MG MT LOZG
1.0000 | LOZENGE | OROMUCOSAL | Status: DC | PRN
  Administered 2016-10-06: 3 mg via ORAL
  Filled 2016-10-06: qty 9

## 2016-10-06 MED ORDER — ACETAMINOPHEN 325 MG PO TABS: 650.0000 mg | ORAL_TABLET | ORAL | Status: DC | PRN

## 2016-10-06 MED ORDER — CRANBERRY 300 MG PO TABS: 600.0000 mg | ORAL_TABLET | Freq: Every day | ORAL | Status: DC

## 2016-10-06 MED ORDER — ROCURONIUM BROMIDE 100 MG/10ML IV SOLN
INTRAVENOUS | Status: DC | PRN
  Administered 2016-10-06: 10 mg via INTRAVENOUS
  Administered 2016-10-06: 20 mg via INTRAVENOUS
  Administered 2016-10-06: 50 mg via INTRAVENOUS
  Administered 2016-10-06: 10 mg via INTRAVENOUS
  Administered 2016-10-06: 20 mg via INTRAVENOUS

## 2016-10-06 MED ORDER — BISMUTH SUBSALICYLATE 262 MG PO CHEW: 524.0000 mg | CHEWABLE_TABLET | Freq: Every day | ORAL | Status: DC | PRN

## 2016-10-06 MED ORDER — LURASIDONE HCL 20 MG PO TABS
60.0000 mg | ORAL_TABLET | Freq: Every day | ORAL | Status: DC
Start: 2016-10-06 — End: 2016-10-10
  Administered 2016-10-06 – 2016-10-09 (×4): 60 mg via ORAL
  Filled 2016-10-06 (×5): qty 3

## 2016-10-06 MED ORDER — SODIUM CHLORIDE 0.9 % IR SOLN
Status: DC | PRN
  Administered 2016-10-06: 09:00:00

## 2016-10-06 MED ORDER — LIDOCAINE 2% (20 MG/ML) 5 ML SYRINGE
INTRAMUSCULAR | Status: AC
  Filled 2016-10-06: qty 5

## 2016-10-06 MED ORDER — LORATADINE 10 MG PO TABS
10.0000 mg | ORAL_TABLET | Freq: Every evening | ORAL | Status: DC
  Administered 2016-10-06 – 2016-10-09 (×4): 10 mg via ORAL
  Filled 2016-10-06 (×4): qty 1

## 2016-10-06 MED ORDER — HYDROXYZINE HCL 25 MG PO TABS: 25.0000 mg | ORAL_TABLET | Freq: Two times a day (BID) | ORAL | Status: DC | PRN

## 2016-10-06 MED ORDER — MIDAZOLAM HCL 2 MG/2ML IJ SOLN
INTRAMUSCULAR | Status: DC | PRN
Start: 2016-10-06 — End: 2016-10-06
  Administered 2016-10-06: 2 mg via INTRAVENOUS

## 2016-10-06 MED ORDER — PANTOPRAZOLE SODIUM 40 MG PO TBEC: 40.0000 mg | DELAYED_RELEASE_TABLET | Freq: Every day | ORAL | Status: DC

## 2016-10-06 MED ORDER — CALCIUM POLYCARBOPHIL 625 MG PO TABS
1875.0000 mg | ORAL_TABLET | Freq: Every day | ORAL | Status: DC
  Administered 2016-10-06 – 2016-10-10 (×4): 1875 mg via ORAL
  Filled 2016-10-06 (×5): qty 3

## 2016-10-06 MED ORDER — ROCURONIUM BROMIDE 50 MG/5ML IV SOSY
PREFILLED_SYRINGE | INTRAVENOUS | Status: AC
  Filled 2016-10-06: qty 10

## 2016-10-06 MED ORDER — HYDROMORPHONE HCL 2 MG/ML IJ SOLN
INTRAMUSCULAR | Status: AC
  Filled 2016-10-06: qty 1

## 2016-10-06 MED ORDER — GABAPENTIN 300 MG PO CAPS
1800.0000 mg | ORAL_CAPSULE | Freq: Every day | ORAL | Status: DC
  Administered 2016-10-06 – 2016-10-09 (×4): 1800 mg via ORAL
  Filled 2016-10-06 (×5): qty 2

## 2016-10-06 MED ORDER — ONDANSETRON HCL 4 MG PO TABS: 4.0000 mg | ORAL_TABLET | Freq: Three times a day (TID) | ORAL | Status: DC | PRN

## 2016-10-06 MED ORDER — LIDOCAINE-EPINEPHRINE 1 %-1:100000 IJ SOLN
INTRAMUSCULAR | Status: DC | PRN
  Administered 2016-10-06: 10 mL

## 2016-10-06 MED ORDER — SUCCINYLCHOLINE CHLORIDE 200 MG/10ML IV SOSY
PREFILLED_SYRINGE | INTRAVENOUS | Status: DC | PRN
  Administered 2016-10-06: 100 mg via INTRAVENOUS

## 2016-10-06 MED ORDER — METOPROLOL SUCCINATE ER 25 MG PO TB24
25.0000 mg | ORAL_TABLET | Freq: Every day | ORAL | Status: DC
  Administered 2016-10-06 – 2016-10-10 (×4): 25 mg via ORAL
  Filled 2016-10-06 (×5): qty 1

## 2016-10-06 MED ORDER — ONDANSETRON HCL 4 MG/2ML IJ SOLN
INTRAMUSCULAR | Status: AC
  Filled 2016-10-06: qty 2

## 2016-10-06 MED ORDER — PROPOFOL 10 MG/ML IV BOLUS
INTRAVENOUS | Status: AC
  Filled 2016-10-06: qty 20

## 2016-10-06 MED ORDER — DEXAMETHASONE SODIUM PHOSPHATE 10 MG/ML IJ SOLN
INTRAMUSCULAR | Status: AC
  Filled 2016-10-06: qty 1

## 2016-10-06 MED ORDER — SODIUM CHLORIDE 0.9 % IV SOLN
INTRAVENOUS | Status: DC
  Administered 2016-10-06: 19:00:00 via INTRAVENOUS

## 2016-10-06 MED ORDER — EPINEPHRINE 0.3 MG/0.3ML IJ SOAJ: 0.3000 mg | INTRAMUSCULAR | Status: DC | PRN

## 2016-10-06 MED ORDER — FAMOTIDINE 20 MG PO TABS
40.0000 mg | ORAL_TABLET | Freq: Every day | ORAL | Status: DC
  Administered 2016-10-07 – 2016-10-09 (×3): 40 mg via ORAL
  Filled 2016-10-06 (×4): qty 2

## 2016-10-06 MED ORDER — ONDANSETRON HCL 4 MG/2ML IJ SOLN: 4.0000 mg | INTRAMUSCULAR | Status: DC | PRN

## 2016-10-06 MED ORDER — DEXTROSE 5 % IV SOLN
500.0000 mg | Freq: Four times a day (QID) | INTRAVENOUS | Status: DC | PRN
  Filled 2016-10-06: qty 5

## 2016-10-06 MED ORDER — ROCURONIUM BROMIDE 50 MG/5ML IV SOSY
PREFILLED_SYRINGE | INTRAVENOUS | Status: AC
  Filled 2016-10-06: qty 5

## 2016-10-06 MED ORDER — DULOXETINE HCL 60 MG PO CPEP
60.0000 mg | ORAL_CAPSULE | Freq: Two times a day (BID) | ORAL | Status: DC
  Administered 2016-10-06 – 2016-10-10 (×8): 60 mg via ORAL
  Filled 2016-10-06 (×8): qty 1

## 2016-10-06 MED ORDER — CITALOPRAM HYDROBROMIDE 20 MG PO TABS
20.0000 mg | ORAL_TABLET | Freq: Every evening | ORAL | Status: DC
  Administered 2016-10-06 – 2016-10-09 (×4): 20 mg via ORAL
  Filled 2016-10-06 (×4): qty 1

## 2016-10-06 MED ORDER — THROMBIN 5000 UNITS EX SOLR
OROMUCOSAL | Status: DC | PRN
  Administered 2016-10-06: 09:00:00 via TOPICAL

## 2016-10-06 MED ORDER — PANTOPRAZOLE SODIUM 40 MG IV SOLR
40.0000 mg | Freq: Every day | INTRAVENOUS | Status: DC
  Administered 2016-10-06 – 2016-10-07 (×2): 40 mg via INTRAVENOUS
  Filled 2016-10-06 (×2): qty 40

## 2016-10-06 MED ORDER — DOCUSATE SODIUM 100 MG PO CAPS
100.0000 mg | ORAL_CAPSULE | Freq: Two times a day (BID) | ORAL | Status: DC
  Administered 2016-10-06 – 2016-10-10 (×8): 100 mg via ORAL
  Filled 2016-10-06 (×8): qty 1

## 2016-10-06 MED ORDER — THROMBIN 20000 UNITS EX SOLR
CUTANEOUS | Status: AC
  Filled 2016-10-06: qty 20000

## 2016-10-06 MED ORDER — TOPIRAMATE 100 MG PO TABS
100.0000 mg | ORAL_TABLET | Freq: Every evening | ORAL | Status: DC
  Administered 2016-10-06 – 2016-10-09 (×4): 100 mg via ORAL
  Filled 2016-10-06 (×4): qty 1

## 2016-10-06 MED ORDER — ARTIFICIAL TEARS OP OINT
TOPICAL_OINTMENT | OPHTHALMIC | Status: AC
  Filled 2016-10-06: qty 3.5

## 2016-10-06 MED ORDER — CYCLOBENZAPRINE HCL 10 MG PO TABS
10.0000 mg | ORAL_TABLET | Freq: Two times a day (BID) | ORAL | Status: DC | PRN
  Administered 2016-10-06 – 2016-10-08 (×4): 20 mg via ORAL
  Administered 2016-10-09: 10 mg via ORAL
  Filled 2016-10-06 (×4): qty 2
  Filled 2016-10-06: qty 1
  Filled 2016-10-06: qty 2

## 2016-10-06 MED ORDER — THROMBIN 5000 UNITS EX SOLR
CUTANEOUS | Status: AC
  Filled 2016-10-06: qty 5000

## 2016-10-06 MED ORDER — BISACODYL 10 MG RE SUPP: 10.0000 mg | Freq: Every day | RECTAL | Status: DC | PRN

## 2016-10-06 MED ORDER — PROMETHAZINE HCL 25 MG/ML IJ SOLN: 6.2500 mg | INTRAMUSCULAR | Status: DC | PRN

## 2016-10-06 MED ORDER — LIDOCAINE 2% (20 MG/ML) 5 ML SYRINGE
INTRAMUSCULAR | Status: DC | PRN
  Administered 2016-10-06: 100 mg via INTRAVENOUS

## 2016-10-06 MED ORDER — LACTATED RINGERS IV SOLN
INTRAVENOUS | Status: DC
  Administered 2016-10-06 (×4): via INTRAVENOUS

## 2016-10-06 MED ORDER — LIDOCAINE-EPINEPHRINE (PF) 2 %-1:200000 IJ SOLN
INTRAMUSCULAR | Status: AC
  Filled 2016-10-06: qty 20

## 2016-10-06 MED ORDER — BUPIVACAINE HCL (PF) 0.5 % IJ SOLN
INTRAMUSCULAR | Status: AC
  Filled 2016-10-06: qty 30

## 2016-10-06 MED ORDER — SENNA 8.6 MG PO TABS
1.0000 | ORAL_TABLET | Freq: Two times a day (BID) | ORAL | Status: DC
  Administered 2016-10-06 – 2016-10-10 (×8): 8.6 mg via ORAL
  Filled 2016-10-06 (×8): qty 1

## 2016-10-06 MED ORDER — PROPOFOL 10 MG/ML IV BOLUS
INTRAVENOUS | Status: DC | PRN
  Administered 2016-10-06: 200 mg via INTRAVENOUS

## 2016-10-06 MED ORDER — LEVOTHYROXINE SODIUM 50 MCG PO TABS
50.0000 ug | ORAL_TABLET | Freq: Every evening | ORAL | Status: DC
  Administered 2016-10-06 – 2016-10-09 (×4): 50 ug via ORAL
  Filled 2016-10-06 (×4): qty 1

## 2016-10-06 MED ORDER — BUPIVACAINE HCL (PF) 0.5 % IJ SOLN
INTRAMUSCULAR | Status: DC | PRN
  Administered 2016-10-06: 10 mL

## 2016-10-06 MED ORDER — PHENOL 1.4 % MT LIQD: 1.0000 | OROMUCOSAL | Status: DC | PRN

## 2016-10-06 MED ORDER — METOCLOPRAMIDE HCL 10 MG PO TABS
10.0000 mg | ORAL_TABLET | Freq: Every evening | ORAL | Status: DC
  Administered 2016-10-06 – 2016-10-09 (×4): 10 mg via ORAL
  Filled 2016-10-06 (×4): qty 1

## 2016-10-06 MED ORDER — ONDANSETRON HCL 4 MG/2ML IJ SOLN
INTRAMUSCULAR | Status: DC | PRN
  Administered 2016-10-06: 4 mg via INTRAVENOUS

## 2016-10-06 MED ORDER — SODIUM CHLORIDE 0.9 % IV SOLN: 250.0000 mL | INTRAVENOUS | Status: DC

## 2016-10-06 MED ORDER — DEXAMETHASONE SODIUM PHOSPHATE 10 MG/ML IJ SOLN
INTRAMUSCULAR | Status: DC | PRN
  Administered 2016-10-06: 10 mg via INTRAVENOUS

## 2016-10-06 MED ORDER — SUGAMMADEX SODIUM 200 MG/2ML IV SOLN
INTRAVENOUS | Status: DC | PRN
  Administered 2016-10-06: 265 mg via INTRAVENOUS

## 2016-10-06 MED ORDER — ZOLPIDEM TARTRATE 5 MG PO TABS
5.0000 mg | ORAL_TABLET | Freq: Every evening | ORAL | Status: DC | PRN
  Administered 2016-10-06 – 2016-10-09 (×4): 5 mg via ORAL
  Filled 2016-10-06 (×4): qty 1

## 2016-10-06 MED ORDER — POLYVINYL ALCOHOL 1.4 % OP SOLN
1.0000 [drp] | Freq: Every day | OPHTHALMIC | Status: DC | PRN
  Filled 2016-10-06: qty 15

## 2016-10-06 MED ORDER — HYDROMORPHONE HCL 1 MG/ML IJ SOLN
0.5000 mg | INTRAMUSCULAR | Status: DC | PRN
  Administered 2016-10-07: 0.5 mg via INTRAVENOUS
  Filled 2016-10-06: qty 1

## 2016-10-06 SURGICAL SUPPLY — 68 items
ADH SKN CLS APL DERMABOND .7 (GAUZE/BANDAGES/DRESSINGS) ×1
BAG DECANTER FOR FLEXI CONT (MISCELLANEOUS) ×2 IMPLANT
BASKET BONE COLLECTION (BASKET) ×2 IMPLANT
BENZOIN TINCTURE PRP APPL 2/3 (GAUZE/BANDAGES/DRESSINGS) IMPLANT
BIT DRILL 3.5 POWEREASE (BIT) ×2 IMPLANT
BLADE CLIPPER SURG (BLADE) IMPLANT
BLADE SURG 11 STRL SS (BLADE) ×2 IMPLANT
BUR MATCHSTICK NEURO 3.0 LAGG (BURR) ×2 IMPLANT
BUR PRECISION FLUTE 5.0 (BURR) ×2 IMPLANT
CANISTER SUCT 3000ML PPV (MISCELLANEOUS) ×2 IMPLANT
CARTRIDGE OIL MAESTRO DRILL (MISCELLANEOUS) ×1 IMPLANT
CONT SPEC 4OZ CLIKSEAL STRL BL (MISCELLANEOUS) ×2 IMPLANT
COVER BACK TABLE 60X90IN (DRAPES) ×2 IMPLANT
DECANTER SPIKE VIAL GLASS SM (MISCELLANEOUS) ×2 IMPLANT
DERMABOND ADVANCED (GAUZE/BANDAGES/DRESSINGS) ×1
DERMABOND ADVANCED .7 DNX12 (GAUZE/BANDAGES/DRESSINGS) ×1 IMPLANT
DIFFUSER DRILL AIR PNEUMATIC (MISCELLANEOUS) ×2 IMPLANT
DRAPE C-ARM 42X72 X-RAY (DRAPES) ×2 IMPLANT
DRAPE C-ARMOR (DRAPES) ×2 IMPLANT
DRAPE LAPAROTOMY 100X72X124 (DRAPES) ×2 IMPLANT
DRAPE POUCH INSTRU U-SHP 10X18 (DRAPES) ×2 IMPLANT
DRAPE SURG 17X23 STRL (DRAPES) ×2 IMPLANT
DRSG OPSITE POSTOP 4X6 (GAUZE/BANDAGES/DRESSINGS) ×2 IMPLANT
DURAPREP 26ML APPLICATOR (WOUND CARE) ×2 IMPLANT
ELECT REM PT RETURN 9FT ADLT (ELECTROSURGICAL) ×2
ELECTRODE REM PT RTRN 9FT ADLT (ELECTROSURGICAL) ×1 IMPLANT
GAUZE SPONGE 4X4 12PLY STRL (GAUZE/BANDAGES/DRESSINGS) IMPLANT
GAUZE SPONGE 4X4 16PLY XRAY LF (GAUZE/BANDAGES/DRESSINGS) IMPLANT
GLOVE BIOGEL PI IND STRL 7.5 (GLOVE) ×1 IMPLANT
GLOVE BIOGEL PI INDICATOR 7.5 (GLOVE) ×1
GLOVE ECLIPSE 7.0 STRL STRAW (GLOVE) ×2 IMPLANT
GLOVE EXAM NITRILE LRG STRL (GLOVE) IMPLANT
GLOVE EXAM NITRILE XL STR (GLOVE) IMPLANT
GLOVE EXAM NITRILE XS STR PU (GLOVE) IMPLANT
GOWN STRL REUS W/ TWL LRG LVL3 (GOWN DISPOSABLE) ×2 IMPLANT
GOWN STRL REUS W/ TWL XL LVL3 (GOWN DISPOSABLE) IMPLANT
GOWN STRL REUS W/TWL 2XL LVL3 (GOWN DISPOSABLE) IMPLANT
GOWN STRL REUS W/TWL LRG LVL3 (GOWN DISPOSABLE) ×2
GOWN STRL REUS W/TWL XL LVL3 (GOWN DISPOSABLE)
HEMOSTAT POWDER KIT SURGIFOAM (HEMOSTASIS) ×2 IMPLANT
KIT BASIN OR (CUSTOM PROCEDURE TRAY) ×2 IMPLANT
KIT INFUSE X SMALL 1.4CC (Orthopedic Implant) ×2 IMPLANT
KIT POSITION SURG JACKSON T1 (MISCELLANEOUS) ×2 IMPLANT
KIT ROOM TURNOVER OR (KITS) ×2 IMPLANT
NEEDLE HYPO 18GX1.5 BLUNT FILL (NEEDLE) IMPLANT
NEEDLE HYPO 25X1 1.5 SAFETY (NEEDLE) ×2 IMPLANT
NEEDLE SPNL 18GX3.5 QUINCKE PK (NEEDLE) IMPLANT
NS IRRIG 1000ML POUR BTL (IV SOLUTION) ×2 IMPLANT
OIL CARTRIDGE MAESTRO DRILL (MISCELLANEOUS) ×2
PACK LAMINECTOMY NEURO (CUSTOM PROCEDURE TRAY) ×2 IMPLANT
PAD ARMBOARD 7.5X6 YLW CONV (MISCELLANEOUS) ×6 IMPLANT
ROD COBALT 47.5X35 (Rod) ×4 IMPLANT
SCREW 5.5X35MM (Screw) ×4 IMPLANT
SCREW BN 35X5.5XMA NS SPNE (Screw) ×4 IMPLANT
SCREW SET SOLERA (Screw) ×8 IMPLANT
SCREW SET SOLERA TI (Screw) ×4 IMPLANT
SPONGE LAP 4X18 X RAY DECT (DISPOSABLE) IMPLANT
SPONGE SURGIFOAM ABS GEL 100 (HEMOSTASIS) ×2 IMPLANT
STRIP CLOSURE SKIN 1/2X4 (GAUZE/BANDAGES/DRESSINGS) IMPLANT
SUT VIC AB 0 CT1 18XCR BRD8 (SUTURE) ×2 IMPLANT
SUT VIC AB 0 CT1 8-18 (SUTURE) ×4
SUT VICRYL 3-0 RB1 18 ABS (SUTURE) ×4 IMPLANT
SYR 3ML LL SCALE MARK (SYRINGE) ×6 IMPLANT
TOWEL OR 17X24 6PK STRL BLUE (TOWEL DISPOSABLE) ×2 IMPLANT
TOWEL OR 17X26 10 PK STRL BLUE (TOWEL DISPOSABLE) ×2 IMPLANT
TRAP SPECIMEN MUCOUS 40CC (MISCELLANEOUS) IMPLANT
TRAY FOLEY W/METER SILVER 16FR (SET/KITS/TRAYS/PACK) ×2 IMPLANT
WATER STERILE IRR 1000ML POUR (IV SOLUTION) ×2 IMPLANT

## 2016-10-06 NOTE — Progress Notes (Signed)
Report given to erika braddock rn as caregiver 

## 2016-10-06 NOTE — Anesthesia Postprocedure Evaluation (Addendum)
Anesthesia Post Note  Patient: Darlene Frazier  Procedure(s) Performed: Procedure(s) (LRB): POSTERIOR LUMBAR INTERBODY FUSION LUMBAR FIVE -SACRAL ONE (N/A)  Patient location during evaluation: PACU Anesthesia Type: General Level of consciousness: awake, oriented, sedated and lethargic Pain management: satisfactory to patient Vital Signs Assessment: post-procedure vital signs reviewed and stable Respiratory status: spontaneous breathing, nonlabored ventilation, respiratory function stable and patient connected to nasal cannula oxygen Cardiovascular status: blood pressure returned to baseline and stable Postop Assessment: no signs of nausea or vomiting Anesthetic complications: no       Last Vitals:  Vitals:   10/06/16 1315 10/06/16 1330  BP: 126/73 134/87  Pulse: (!) 128 (!) 115  Resp: 18 12  Temp:      Last Pain:  Vitals:   10/06/16 1345  TempSrc:   PainSc: 8                  Assad Harbeson,JAMES TERRILL

## 2016-10-06 NOTE — Op Note (Signed)
PREOP DIAGNOSIS:  1. Postlaminectomy Syndrome 2. Lumbago with radiuclopathy  POSTOP DIAGNOSIS: Same  PROCEDURE: 1. L5 laminectomy with facetectomy for decompression of exiting nerve roots, more than would be required for placement of interbody graft 2. Posterior instrumentation using cortical pedicle screws at L5 - S1 3. Posterolateral arthrodesis, L5-S1 4. Use of locally harvested bone autograft 5. Use of non-structural bone allograft - BMP  SURGEON: Dr. Lisbeth Renshaw, MD  ASSISTANT: Dr. Cherrie Distance, MD  ANESTHESIA: General Endotracheal  EBL: 300cc  SPECIMENS: None  DRAINS: None  COMPLICATIONS: None immediate  CONDITION: Hemodynamically stable to PACU  HISTORY: Darlene Frazier is a 36 y.o. female who underwent left-sided L5-S1 laminotomy and microdiscectomy proximally 3 years ago. She had good pain relief initially, but over the course of the last few years slowly began to develop back and recurrent left-sided leg pain. She was managed by Dr. Ollen Bowl from our pain management team, but continued to have progressive pain despite medical therapy, injection therapy, etc. She was seen again in my office, and repeat MRI was reviewed. This did demonstrate continued stenosis primarily on the left at L5-S1, with disc desiccation and loss of height. She therefore elected to proceed with fusion at L5-S1. The risks, benefits, and alternatives to the surgery were reviewed in detail to the patient. After all questions were answered informed consent was obtained and witnessed.  PROCEDURE IN DETAIL: After informed consent was obtained and witnessed, the patient was brought to the operating room. After induction of general anesthesia, the patient was positioned on the operative table in the prone position. All pressure points were meticulously padded. Incision was then marked out and prepped and draped in the usual sterile fashion.  After timeout was conducted, skin was infiltrated with  local anesthetic. Previous skin incision was then opened sharply and Bovie electrocautery was used to dissect the subcutaneous tissue until the lumbodorsal fascia was identified and incised. The muscle was then elevated in the subperiosteal plane and the L5 and S1 levels were exposed to the level of the pars. Self-retaining retractors were then placed.  Using intraoperative fluoroscopy, entry points for bilateral L5 cortical pedicle screws were identified. Pilot holes were then created under lateral fluoroscopy and tapped to 5.5 x 35 mm.  At this point attention was turned to decompression. Complete L5 laminectomy with facetectomy was completed using a combination of Kerrison rongeurs and a high-speed drill. The ligamentum flavum in the subarticular space on the left was calcified, and was significantly hypertrophic. The superior articulating process especially on the left also appeared to be significantly hypertrophic. There was likely ongoing compression of both the exiting left L5 nerve root, as well as the S1 root. These were decompressed of all bony and ligamentous elements.  At this point, the disc space was inspected, but was noted to be completely calcified. I therefore elected not to proceed with interbody fusion which would require osteotome, and breaking of the calcification. Instead, I elected to stabilize with the cortical pedicle screws and proceed with posterolateral arthrodesis.  Pilot holes for S1 screws were drilled and tapped to 5.5 x 35 mm under fluoro. Screws were then placed in L5 and S1. Rod was then placed, set screws placed and final tightened. Final AP and lateral fluoroscopic images confirmed good position.  The wound was then irrigated with copious amounts of antibiotic saline, then closed in standard fashion using a combination of interrupted 0 and 3-0 Vicryl stitches in the muscular, fascial, and subcutaneous layers. Skin was then  closed using standard Dermabond. Sterile  dressing was then applied. The patient was then transferred to the stretcher, extubated, and taken to the postanesthesia care unit in stable hemodynamic condition.  At the end of the case all sponge, needle, cottonoid, and instrument counts were correct.

## 2016-10-06 NOTE — Anesthesia Preprocedure Evaluation (Addendum)
Anesthesia Evaluation  Patient identified by MRN, date of birth, ID band Patient awake    Reviewed: Allergy & Precautions, NPO status , Patient's Chart, lab work & pertinent test results  Airway Mallampati: I  TM Distance: >3 FB Neck ROM: Full    Dental  (+) Teeth Intact   Pulmonary asthma ,    breath sounds clear to auscultation       Cardiovascular hypertension,  Rhythm:Regular Rate:Tachycardia     Neuro/Psych    GI/Hepatic GERD  ,  Endo/Other  Hypothyroidism Morbid obesity  Renal/GU Renal disease     Musculoskeletal  (+) Arthritis , Fibromyalgia -  Abdominal (+) + obese,   Peds  Hematology   Anesthesia Other Findings   Reproductive/Obstetrics                            Anesthesia Physical Anesthesia Plan  ASA: III  Anesthesia Plan: General   Post-op Pain Management:    Induction: Intravenous  Airway Management Planned: Oral ETT  Additional Equipment:   Intra-op Plan:   Post-operative Plan: Extubation in OR  Informed Consent: I have reviewed the patients History and Physical, chart, labs and discussed the procedure including the risks, benefits and alternatives for the proposed anesthesia with the patient or authorized representative who has indicated his/her understanding and acceptance.     Plan Discussed with:   Anesthesia Plan Comments:         Anesthesia Quick Evaluation

## 2016-10-06 NOTE — Progress Notes (Signed)
Orthopedic Tech Progress Note Patient Details:  Darlene Frazier 08/23/1981 [id number]  Patient ID: Darlene Frazier, female   DOB: 12/21/1980, 36 y.o.   MRN: [id number]   Saul FordyceJennifer C Travarus Trudo 10/06/2016, 4:49 PMCalled [employer] for Lumbar brace.

## 2016-10-06 NOTE — Transfer of Care (Signed)
Immediate Anesthesia Transfer of Care Note  Patient: Darlene Frazier  Procedure(s) Performed: Procedure(s): POSTERIOR LUMBAR INTERBODY FUSION LUMBAR FIVE -SACRAL ONE (N/A)  Patient Location: PACU  Anesthesia Type:General  Level of Consciousness: awake, alert  and oriented  Airway & Oxygen Therapy: Patient Spontanous Breathing and Patient connected to nasal cannula oxygen  Post-op Assessment: Report given to RN, Post -op Vital signs reviewed and stable and Patient moving all extremities  Post vital signs: Reviewed and stable  Last Vitals:  Vitals:   10/06/16 0700  BP: (!) 132/93  Pulse: (!) 112  Resp: 20  Temp: 36.9 C    Last Pain:  Vitals:   10/06/16 0845  TempSrc:   PainSc: 7       Patients Stated Pain Goal: 3 (10/06/16 0845)  Complications: No apparent anesthesia complications

## 2016-10-06 NOTE — H&P (Signed)
CC:  Back and left leg pain  HPI: Darlene Frazier is a 36 year old woman I am seeing in follow-up. She was last seen by me about 3 years ago after I had done a left L5-S1 laminotomy and microdiscectomy. Initially after surgery, she did have improvement in her left leg pain, but did continue to have some back and leg pain. I did recommend a course of physical therapy, after which she was lost to follow-up. She recently was seen by Dr. Ollen Bowl in our pain management office after being re-referred by her primary physician for the same back and leg pain. She continues to describe deep, aching type pain in her lower back, with sharp, shooting type radiation to the left buttock, thigh, calf, and numbness and tingling involving primarily the lateral aspect of her left foot. She has been on some pain medicine as well as neuromodulator's, but her pain continues to worsen. She has also undergone bilateral transforaminal epidural steroid injections which only provided about a week's worth of improvement primarily in her leg symptoms. She did not really get any benefit for her back pain from the injection. Repeat MRI was obtained by Dr. Ollen Bowl and she was referred back to me for consideration of surgical intervention as she has failed conservative/nonsurgical treatments.   PMH: Past Medical History:  Diagnosis Date  . Arthritis   . Asthma    exercised induced  . Bipolar 1 disorder (HCC)   . Borderline personality disorder   . Depression   . Fibromyalgia   . GERD (gastroesophageal reflux disease)   . H/O hiatal hernia   . Headache(784.0)    treated with Topamax  . Hypertension   . Hypothyroidism   . Insomnia   . Pneumonia    "walking" pneumonia  . Renal disorder    kidney stones with stent  . Snores   . Tachycardia   . Thyroid disease     PSH: Past Surgical History:  Procedure Laterality Date  . BREAST SURGERY Right    biopsy  . CHOLECYSTECTOMY    . ESOPHAGOGASTRODUODENOSCOPY N/A 11/16/2015    Procedure: ESOPHAGOGASTRODUODENOSCOPY (EGD);  Surgeon: Dorena Cookey, MD;  Location: [facility] ENDOSCOPY;  Service: Endoscopy;  Laterality: N/A;  . FOOT SURGERY Left 1999   nodule removed  . kidney stent  2007   placement and removal  . LUMBAR LAMINECTOMY/DECOMPRESSION MICRODISCECTOMY Left 06/03/2013   Procedure: LEFT LUMBAR FIVE SACRAL ONE LAMINECTOMY/DECOMPRESSION MICRODISCECTOMY ;  Surgeon: Lisbeth Renshaw, MD;  Location: [facility] NEURO ORS;  Service: Neurosurgery;  Laterality: Left;  LEFT L5S1 microdiskectomy    SH: Social History  Substance Use Topics  . Smoking status: Never Smoker  . Smokeless tobacco: Never Used  . Alcohol use Yes     Comment: socially    MEDS: Prior to Admission medications   Medication Sig Start Date End Date Taking? Authorizing Provider  bismuth subsalicylate (PEPTO BISMOL) 262 MG chewable tablet Chew 524 mg by mouth daily as needed for indigestion.   Yes Historical Provider, MD  citalopram (CELEXA) 20 MG tablet Take 20 mg by mouth every evening.   Yes Historical Provider, MD  Cranberry (SM CRANBERRY) 300 MG tablet Take 600 mg by mouth at bedtime.   Yes Historical Provider, MD  cyclobenzaprine (FLEXERIL) 10 MG tablet Take 10-20 mg by mouth 2 (two) times daily as needed for muscle spasms.   Yes Historical Provider, MD  dexlansoprazole (DEXILANT) 60 MG capsule Take 60 mg by mouth every evening.   Yes Historical Provider, MD  doxylamine, Sleep, (  UNISOM) 25 MG tablet Take 75 mg by mouth at bedtime.   Yes Historical Provider, MD  DULoxetine (CYMBALTA) 60 MG capsule Take 60 mg by mouth 2 (two) times daily.   Yes Historical Provider, MD  EPINEPHrine 0.3 mg/0.3 mL IJ SOAJ injection Inject 0.3 mg into the muscle as needed (allergic reaction).    Yes Historical Provider, MD  gabapentin (NEURONTIN) 300 MG capsule Take 1,800 mg by mouth at bedtime.    Yes Historical Provider, MD  gemfibrozil (LOPID) 600 MG tablet Take 600 mg by mouth every evening.    Yes Historical Provider, MD   HYDROcodone-acetaminophen (NORCO/VICODIN) 5-325 MG tablet Take 1 tablet by mouth 2 (two) times daily as needed for moderate pain.   Yes Historical Provider, MD  hydrOXYzine (ATARAX/VISTARIL) 25 MG tablet Take 25 mg by mouth 2 (two) times daily as needed for itching.   Yes Historical Provider, MD  ibuprofen (ADVIL,MOTRIN) 800 MG tablet Take 800 mg by mouth 2 (two) times daily as needed for pain. 08/02/16  Yes Historical Provider, MD  levothyroxine (SYNTHROID, LEVOTHROID) 50 MCG tablet Take 50 mcg by mouth every evening.    Yes Historical Provider, MD  loratadine (CLARITIN) 10 MG tablet Take 10 mg by mouth every evening.   Yes Historical Provider, MD  Lurasidone HCl (LATUDA) 60 MG TABS Take 60 mg by mouth daily with supper.   Yes Historical Provider, MD  metoCLOPramide (REGLAN) 10 MG tablet Take 10 mg by mouth every evening.    Yes Historical Provider, MD  metoprolol succinate (TOPROL-XL) 25 MG 24 hr tablet Take 25 mg by mouth daily.   Yes Historical Provider, MD  ondansetron (ZOFRAN) 4 MG tablet Take 4 mg by mouth every 8 (eight) hours as needed for nausea or vomiting.   Yes Historical Provider, MD  polycarbophil (RA FIBER-TAB) 625 MG tablet Take 1,875 mg by mouth at bedtime.    Yes Historical Provider, MD  Polyvinyl Alcohol (LUBRICANT DROPS OP) Apply 1 drop to eye daily as needed (dry eyes).   Yes Historical Provider, MD  ranitidine (ZANTAC) 150 MG tablet Take 300 mg by mouth at bedtime.   Yes Historical Provider, MD  Simethicone (GAS-X PO) Take 1-2 tablets by mouth daily as needed (gas).   Yes Historical Provider, MD  topiramate (TOPAMAX) 50 MG tablet Take 100 mg by mouth every evening.    Yes Historical Provider, MD    ALLERGY: Allergies  Allergen Reactions  . Other Anaphylaxis and Hives    Grass and hay  . Morphine And Related Other (See Comments)    Severe migraines in high doses    ROS: ROS  NEUROLOGIC EXAM: Awake, alert, oriented Memory and concentration grossly intact Speech  fluent, appropriate CN grossly intact Motor exam: Upper Extremities Deltoid Bicep Tricep Grip  Right 5/5 5/5 5/5 5/5  Left 5/5 5/5 5/5 5/5   Lower Extremity IP Quad PF DF EHL  Right 5/5 5/5 5/5 5/5 5/5  Left 5/5 5/5 5/5 5/5 5/5   Sensation grossly intact to LT  [facility] MRI of the lumbar spine dated 05/12/16 was reviewed. This demonstrates primary pathology at L5-S1 where there is distal desiccation and loss of height. There is a broad-based disc bulge, and resultant bilateral lateral recess stenosis. There is also mild to moderate left-sided foraminal stenosis.   IMPRESSION: 36 year old woman with continued back and left leg pain now 3 years after left-sided L5-S1 microdiscectomy, consistent with a postlaminectomy syndrome. She has failed reasonable conservative treatments over the years, including  physical therapy, anti-inflammatory and neuromodulator's, and injection therapy. She says that her pain is functionally limiting.   PLAN: - Proceed with L5-S1 decompression and interbody fusion with cortical pedicle screw stabilization   I reviewed the MRI findings with the patient in the office. Treatment options at this point were discussed including continued conservative treatment versus an attempt at surgical decompression and fusion. I explained to the patient the details of the procedure, as well as the risks which include but are not limited to nerve root injury leading to leg or foot weakness/numbness and/or bowel and bladder dysfunction, CSF leak, bleeding, and infection. Possible outcomes of surgery were also discussed including the possibility of persistence or worsening of pain symptoms and the possiblity of accelerated adjacent level degeneration. The general risks of anesthesia were also reviewed including heart attack, stroke, and DVT/PE.  The patient understood our discussion and is willing to proceed with surgical decompression and fusion. All questions were answered

## 2016-10-06 NOTE — Anesthesia Procedure Notes (Signed)
Procedure Name: Intubation Date/Time: 10/06/2016 9:15 AM Performed by: Orvilla FusATO, Swannie Milius A Pre-anesthesia Checklist: Patient identified, Emergency Drugs available, Suction available and Patient being monitored Patient Re-evaluated:Patient Re-evaluated prior to inductionOxygen Delivery Method: Circle System Utilized Preoxygenation: Pre-oxygenation with 100% oxygen Intubation Type: IV induction Ventilation: Mask ventilation without difficulty and Oral airway inserted - appropriate to patient size Laryngoscope Size: Glidescope and 4 Grade View: Grade I Tube type: Oral Number of attempts: 1 Airway Equipment and Method: Stylet and Oral airway Placement Confirmation: ETT inserted through vocal cords under direct vision,  positive ETCO2 and breath sounds checked- equal and bilateral Secured at: 21 cm Tube secured with: Tape Dental Injury: Teeth and Oropharynx as per pre-operative assessment

## 2016-10-07 LAB — CBC
HCT: 29.9 % — ABNORMAL LOW (ref 36.0–46.0)
HEMOGLOBIN: 9.8 g/dL — AB (ref 12.0–15.0)
MCH: 30.4 pg (ref 26.0–34.0)
MCHC: 32.8 g/dL (ref 30.0–36.0)
MCV: 92.9 fL (ref 78.0–100.0)
PLATELETS: 363 10*3/uL (ref 150–400)
RBC: 3.22 MIL/uL — AB (ref 3.87–5.11)
RDW: 13.1 % (ref 11.5–15.5)
WBC: 11.1 10*3/uL — ABNORMAL HIGH (ref 4.0–10.5)

## 2016-10-07 LAB — BASIC METABOLIC PANEL
Anion gap: 6 (ref 5–15)
BUN: 10 mg/dL (ref 6–20)
CHLORIDE: 111 mmol/L (ref 101–111)
CO2: 21 mmol/L — ABNORMAL LOW (ref 22–32)
CREATININE: 0.88 mg/dL (ref 0.44–1.00)
Calcium: 8.8 mg/dL — ABNORMAL LOW (ref 8.9–10.3)
Glucose, Bld: 123 mg/dL — ABNORMAL HIGH (ref 65–99)
Potassium: 3.5 mmol/L (ref 3.5–5.1)
SODIUM: 138 mmol/L (ref 135–145)

## 2016-10-07 NOTE — Evaluation (Signed)
Occupational Therapy Evaluation Patient Details Name: Darlene Frazier MRN: [id number] DOB: 11/26/1980 Today's Date: 10/07/2016    History of Present Illness Pt is a 36 y/o female who presents s/p L5-S1 posterior lumbar fusion on 10/06/16.   Clinical Impression   Pt reports she required assist occasionally with LB dressing PTA. Currently pt requires min guard assist for ADL and functional mobility with the exception of min assist for LB ADL. Began back, safety, and ADL education with pt; she verbalized understanding. Pt planning to d/c home with 24/7 supervision from family. Pt would benefit from continued skilled OT to address established goals.    Follow Up Recommendations  No OT follow up;Supervision/Assistance - 24 hour (initially)    Equipment Recommendations  3 in 1 bedside commode    Recommendations for Other Services       Precautions / Restrictions Precautions Precautions: Fall;Back Precaution Booklet Issued: No Precaution Comments: Pt able to recall 2/3 back precautions. Required Braces or Orthoses: Spinal Brace Spinal Brace: Lumbar corset;Applied in sitting position Restrictions Weight Bearing Restrictions: No      Mobility Bed Mobility Overal bed mobility: Needs Assistance Bed Mobility: Rolling;Sit to Sidelying Rolling: Supervision Sidelying to sit: Min guard     Sit to sidelying: Min guard General bed mobility comments: Cues for technique. HOB flat with use of bed rails.  Transfers Overall transfer level: Needs assistance Equipment used: Rolling walker (2 wheeled) Transfers: Sit to/from Stand Sit to Stand: Min guard         General transfer comment: Min guard for safey with sit to stand from chair x1, BSC x1. Good hand placement and technique.    Balance Overall balance assessment: Needs assistance Sitting-balance support: Feet supported;No upper extremity supported Sitting balance-Leahy Scale: Good     Standing balance support: No upper  extremity supported;During functional activity Standing balance-Leahy Scale: Fair Standing balance comment: Able to stand at sink and wash hands/complete peri care in standing without UE support                            ADL Overall ADL's : Needs assistance/impaired Eating/Feeding: Independent;Sitting   Grooming: Min guard;Standing;Wash/dry hands   Upper Body Bathing: Set up;Supervision/ safety;Sitting   Lower Body Bathing: Minimal assistance;Sit to/from stand   Upper Body Dressing : Set up;Supervision/safety;Sitting Upper Body Dressing Details (indicate cue type and reason): to doff brace and gown Lower Body Dressing: Minimal assistance;Sit to/from stand Lower Body Dressing Details (indicate cue type and reason): Min assist to don underwear. Anticipate pt would require assist with socks/shoes Toilet Transfer: Min guard;Ambulation;BSC;RW Toilet Transfer Details (indicate cue type and reason): BSC over toilet Toileting- Clothing Manipulation and Hygiene: Min guard;Sit to/from stand Toileting - Clothing Manipulation Details (indicate cue type and reason): Educated on peri care technique without twisting and what she can use as a toilet aide if needed.      Functional mobility during ADLs: Min guard;Rolling walker General ADL Comments: Educated pt on maintaining back precautions during functional activities and log roll technique for bed mobility.     Vision     Perception     Praxis      Pertinent Vitals/Pain Pain Assessment: 0-10 Pain Score: 8  Pain Location: back Pain Descriptors / Indicators: Aching Pain Intervention(s): Monitored during session;Repositioned;Patient requesting pain meds-RN notified     Hand Dominance Right   Extremity/Trunk Assessment Upper Extremity Assessment Upper Extremity Assessment: Overall WFL for tasks assessed   Lower  Extremity Assessment Lower Extremity Assessment: Defer to PT evaluation LLE Deficits / Details: Decreased  strength and radiating pain consistent with pre-op diagnosis.    Cervical / Trunk Assessment Cervical / Trunk Assessment: Other exceptions Cervical / Trunk Exceptions: s/p spinal sx   Communication Communication Communication: No difficulties   Cognition Arousal/Alertness: Awake/alert Behavior During Therapy: WFL for tasks assessed/performed Overall Cognitive Status: Within Functional Limits for tasks assessed                     General Comments       Exercises       Shoulder Instructions      Home Living Family/patient expects to be discharged to:: Private residence Living Arrangements: Spouse/significant other Available Help at Discharge: Family;Available 24 hours/day Type of Home: Apartment Home Access: Level entry     Home Layout: One level     Bathroom Shower/Tub: Tub/shower unit Shower/tub characteristics: Engineer, building services: Standard Bathroom Accessibility: Yes   Home Equipment: Shower seat;Cane - single point;Adaptive equipment Adaptive Equipment: Reacher        Prior Functioning/Environment Level of Independence: Needs assistance  Gait / Transfers Assistance Needed: Gilmer Mor for long distances. Furniture walked at home occasionally ADL's / Homemaking Assistance Needed: Assist getting in/out of the shower, washing hair and back, and drying. occasional assist for shoes/socks.             OT Problem List: Decreased strength;Decreased activity tolerance;Impaired balance (sitting and/or standing);Decreased knowledge of use of DME or AE;Decreased knowledge of precautions;Obesity;Pain   OT Treatment/Interventions: Self-care/ADL training;Energy conservation;DME and/or AE instruction;Therapeutic activities;Patient/family education;Balance training    OT Goals(Current goals can be found in the care plan section) Acute Rehab OT Goals Patient Stated Goal: home at d/c OT Goal Formulation: With patient Time For Goal Achievement: 10/21/16 Potential to  Achieve Goals: Good ADL Goals Pt Will Perform Grooming: with supervision;standing Pt Will Perform Lower Body Bathing: with supervision;sit to/from stand (with or without AE) Pt Will Perform Lower Body Dressing: with supervision;sit to/from stand (with or without AE) Pt Will Transfer to Toilet: with supervision;ambulating;bedside commode (over toilet) Pt Will Perform Toileting - Clothing Manipulation and hygiene: with supervision;sit to/from stand Pt Will Perform Tub/Shower Transfer: Tub transfer;with supervision;ambulating;shower seat;rolling walker Additional ADL Goal #1: Pt will independently verbally recall 3/3 back precautions and maintain throghout ADL. Additional ADL Goal #2: Pt will don/doff back brace with set up as precursor to ADL and functional mobility.  OT Frequency: Min 2X/week   Barriers to D/C:            Co-evaluation              End of Session Equipment Utilized During Treatment: Back brace;Rolling walker Nurse Communication: Mobility status;Patient requests pain meds (pt requesting ice pack)  Activity Tolerance: Patient tolerated treatment well Patient left: in bed;with call bell/phone within reach;with bed alarm set   Time: 1610-9604 OT Time Calculation (min): 16 min Charges:  OT General Charges $OT Visit: 1 Procedure OT Evaluation $OT Eval Moderate Complexity: 1 Procedure G-Codes:     Gaye Alken M.S., OTR/L Pager: [phone]  10/07/2016, 2:39 PM

## 2016-10-07 NOTE — Progress Notes (Signed)
Pt seen and examined. No issues overnight.  Pain fairly well controlled.  EXAM: Temp:  [97.7 F (36.5 C)-98.5 F (36.9 C)] 98.3 F (36.8 C) (01/06 0511) Pulse Rate:  [93-128] 93 (01/06 0511) Resp:  [12-20] 16 (01/06 0511) BP: (91-134)/(48-87) 91/48 (01/06 0511) SpO2:  [97 %-98 %] 98 % (01/06 0511) Intake/Output      01/05 0701 - 01/06 0700 01/06 0701 - 01/07 0700   I.V. 2200    Total Intake 2200     Urine 2830    Blood 300    Total Output 3130     Net -930           Awake and alert Follows commands throughout Full strength  Stable Continue current care Encouraged ambulation

## 2016-10-07 NOTE — Progress Notes (Signed)
Patient complained of right calf pain when moving in bed.  No noted redness, swelling to right calf. Patient able to flex foot without pain or discomfort.   SCD's removed.  While examining, patient noted pain abated.  SCD's back on bilateral.

## 2016-10-07 NOTE — Evaluation (Signed)
Physical Therapy Evaluation Patient Details Name: Darlene Frazier MRN: [id number] DOB: 1981-01-14 Today's Date: 10/07/2016   History of Present Illness  Pt is a 36 y/o female who presents s/p L5-S1 posterior lumbar fusion on 10/06/16.  Clinical Impression  Pt admitted with above diagnosis. Pt currently with functional limitations due to the deficits listed below (see PT Problem List). At the time of PT eval pt was able to perform transfers and ambulation with min guard to min assist for balance support and general safety. Pt was educated on precautions, positioning, walking program, and general safety. Pt will benefit from skilled PT to increase their independence and safety with mobility to allow discharge to the venue listed below.       Follow Up Recommendations Outpatient PT    Equipment Recommendations  Rolling walker with 5" wheels    Recommendations for Other Services       Precautions / Restrictions Precautions Precautions: Fall;Back Precaution Booklet Issued: No Precaution Comments: Reviewed precautions verbally with pt Required Braces or Orthoses: Spinal Brace Spinal Brace: Lumbar corset;Applied in sitting position Restrictions Weight Bearing Restrictions: No      Mobility  Bed Mobility Overal bed mobility: Needs Assistance Bed Mobility: Rolling;Sidelying to Sit Rolling: Supervision Sidelying to sit: Min guard       General bed mobility comments: VC's for log roll technique. HOB flat and rails lowered to simulate home environment.   Transfers Overall transfer level: Needs assistance Equipment used: Rolling walker (2 wheeled) Transfers: Sit to/from Stand Sit to Stand: Min assist         General transfer comment: Assist for initial attempt to stand. Pt demonstrated good hand placement on seated surface for safety.   Ambulation/Gait Ambulation/Gait assistance: Min guard Ambulation Distance (Feet): 150 Feet Assistive device: Rolling walker (2 wheeled) Gait  Pattern/deviations: Step-through pattern;Decreased stride length;Trunk flexed Gait velocity: Decreased Gait velocity interpretation: Below normal speed for age/gender General Gait Details: VC's for improved posture and general safety with mobility.   Stairs            Wheelchair Mobility    Modified Rankin (Stroke Patients Only)       Balance Overall balance assessment: Needs assistance Sitting-balance support: Feet supported;No upper extremity supported Sitting balance-Leahy Scale: Good     Standing balance support: No upper extremity supported;During functional activity Standing balance-Leahy Scale: Poor Standing balance comment: Requires UE support                             Pertinent Vitals/Pain Pain Assessment: 0-10 Pain Score: 6  Pain Location: Back Pain Descriptors / Indicators: Operative site guarding;Discomfort Pain Intervention(s): Limited activity within patient's tolerance;Monitored during session;Repositioned;Patient requesting pain meds-RN notified    Home Living Family/patient expects to be discharged to:: Private residence Living Arrangements: Spouse/significant other Available Help at Discharge: Family;Available 24 hours/day Type of Home: Apartment Home Access: Level entry     Home Layout: One level Home Equipment: Shower seat;Cane - single point;Adaptive equipment      Prior Function Level of Independence: Needs assistance   Gait / Transfers Assistance Needed: Gilmer Mor for long distances. Furniture walked at home occasionally  ADL's / Homemaking Assistance Needed: Assist getting in/out of the shower, washing hair and back, and drying. occasional assist for shoes/socks.         Hand Dominance   Dominant Hand: Right    Extremity/Trunk Assessment   Upper Extremity Assessment Upper Extremity Assessment: Defer to OT evaluation  Lower Extremity Assessment Lower Extremity Assessment: LLE deficits/detail LLE Deficits /  Details: Decreased strength and radiating pain consistent with pre-op diagnosis.     Cervical / Trunk Assessment Cervical / Trunk Assessment: Normal  Communication   Communication: No difficulties  Cognition Arousal/Alertness: Awake/alert Behavior During Therapy: WFL for tasks assessed/performed Overall Cognitive Status: Within Functional Limits for tasks assessed                      General Comments General comments (skin integrity, edema, etc.): LLE propped up on pillows/blankets when sitting up in the chair    Exercises     Assessment/Plan    PT Assessment Patient needs continued PT services  PT Problem List Decreased strength;Decreased range of motion;Decreased activity tolerance;Decreased balance;Decreased mobility;Decreased knowledge of use of DME;Decreased safety awareness;Decreased knowledge of precautions;Pain          PT Treatment Interventions DME instruction;Gait training;Stair training;Functional mobility training;Therapeutic activities;Therapeutic exercise;Neuromuscular re-education;Patient/family education    PT Goals (Current goals can be found in the Care Plan section)  Acute Rehab PT Goals Patient Stated Goal: home at d/c PT Goal Formulation: With patient Time For Goal Achievement: 10/14/16 Potential to Achieve Goals: Good    Frequency Min 5X/week   Barriers to discharge        Co-evaluation               End of Session Equipment Utilized During Treatment: Gait belt;Back brace Activity Tolerance: Patient tolerated treatment well Patient left: in chair;with call bell/phone within reach Nurse Communication: Mobility status         Time: 1043-1110 PT Time Calculation (min) (ACUTE ONLY): 27 min   Charges:   PT Evaluation $PT Eval Moderate Complexity: 1 Procedure PT Treatments $Gait Training: 8-22 mins   PT G Codes:        Marylynn PearsonLaura D Martin Smeal 10/07/2016, 1:23 PM   Conni SlipperLaura Lamar Meter, PT, DPT Acute Rehabilitation Services Pager:  [phone]

## 2016-10-07 NOTE — Progress Notes (Signed)
Unable to ambulate patient this morning..waiting on brace from ortho.

## 2016-10-08 MED ORDER — OXYCODONE HCL 5 MG PO TABS
5.0000 mg | ORAL_TABLET | ORAL | Status: DC | PRN
  Administered 2016-10-08 – 2016-10-10 (×8): 10 mg via ORAL
  Filled 2016-10-08 (×8): qty 2

## 2016-10-08 MED ORDER — PANTOPRAZOLE SODIUM 40 MG PO TBEC
40.0000 mg | DELAYED_RELEASE_TABLET | Freq: Every day | ORAL | Status: DC
  Administered 2016-10-08 – 2016-10-09 (×2): 40 mg via ORAL
  Filled 2016-10-08 (×2): qty 1

## 2016-10-08 NOTE — Progress Notes (Signed)
Physical Therapy Treatment Patient Details Name: Reita Chardiccolina L Hyden MRN: [id number] DOB: 04/25/1981 Today's Date: 10/08/2016    History of Present Illness Pt is a 36 y/o female who presents s/p L5-S1 posterior lumbar fusion on 10/06/16.    PT Comments    Pt progressing well. Pt is at supervision level for functional mobility. Very slow cadence with gait.  Follow Up Recommendations  Outpatient PT     Equipment Recommendations  Rolling walker with 5" wheels    Recommendations for Other Services       Precautions / Restrictions Precautions Precautions: Fall;Back Precaution Comments: Pt able to recall 2/3 back precautions. Cueing required to remember twisting. Required Braces or Orthoses: Spinal Brace Spinal Brace: Lumbar corset;Applied in sitting position    Mobility  Bed Mobility     Rolling: Modified independent (Device/Increase time) Sidelying to sit: Supervision       General bed mobility comments: Pt demo good technique with logroll.  Transfers   Equipment used: Rolling walker (2 wheeled)   Sit to Stand: Supervision         General transfer comment: Pt demo safe technique. Increased time to complete.  Ambulation/Gait Ambulation/Gait assistance: Supervision Ambulation Distance (Feet): 150 Feet Assistive device: Rolling walker (2 wheeled) Gait Pattern/deviations: Step-through pattern;Decreased stride length Gait velocity: Decreased Gait velocity interpretation: Below normal speed for age/gender General Gait Details: verbal cues for posture   Stairs            Wheelchair Mobility    Modified Rankin (Stroke Patients Only)       Balance                                    Cognition Arousal/Alertness: Awake/alert Behavior During Therapy: WFL for tasks assessed/performed Overall Cognitive Status: Within Functional Limits for tasks assessed                      Exercises      General Comments        Pertinent  Vitals/Pain Pain Assessment: 0-10 Pain Score: 7  Pain Location: back Pain Descriptors / Indicators: Aching Pain Intervention(s): Monitored during session;Repositioned;Patient requesting pain meds-RN notified    Home Living                      Prior Function            PT Goals (current goals can now be found in the care plan section) Acute Rehab PT Goals Patient Stated Goal: home at d/c PT Goal Formulation: With patient Time For Goal Achievement: 10/14/16 Potential to Achieve Goals: Good Progress towards PT goals: Progressing toward goals    Frequency    Min 5X/week      PT Plan Current plan remains appropriate    Co-evaluation             End of Session Equipment Utilized During Treatment: Gait belt;Back brace Activity Tolerance: Patient tolerated treatment well Patient left: in chair;with call bell/phone within reach     Time: 0829-0854 PT Time Calculation (min) (ACUTE ONLY): 25 min  Charges:  $Gait Training: 23-37 mins                    G Codes:      Ilda FoilGarrow, Tyshay Adee Rene 10/08/2016, 9:28 AM

## 2016-10-08 NOTE — Progress Notes (Signed)
Occupational Therapy Treatment Patient Details Name: Darlene Frazier MRN: [id number] DOB: 04/08/1981 Today's Date: 10/08/2016    History of present illness Pt is a 36 y/o female who presents s/p L5-S1 posterior lumbar fusion on 10/06/16.   OT comments  Pt. Making gains with skilled OT. Able to complete tub transfer with min guard A.  Educated on walker safety during transfer.  Reviewed energy conservation tech. And A/E for toileting needs.  Reports boyfriend able to assist as needed with LB ADLS.  Note d/c likely soon.  Will continue to follow acutely.    Follow Up Recommendations  No OT follow up;Supervision/Assistance - 24 hour    Equipment Recommendations  3 in 1 bedside commode    Recommendations for Other Services      Precautions / Restrictions Precautions Precautions: Fall;Back Precaution Comments: able to recall 3/3 back precautions Required Braces or Orthoses: Spinal Brace Spinal Brace: Lumbar corset;Applied in sitting position       Mobility Bed Mobility     Rolling: Modified independent (Device/Increase time) Sidelying to sit: Supervision       General bed mobility comments: pt. seated in recliner at beginning and end of our session  Transfers Overall transfer level: Needs assistance Equipment used: Rolling walker (2 wheeled) Transfers: Sit to/from UGI CorporationStand;Stand Pivot Transfers Sit to Stand: Supervision Stand pivot transfers: Supervision       General transfer comment: Pt demo safe technique. Increased time to complete.    Balance                                   ADL Overall ADL's : Needs assistance/impaired                           Toilet Transfer Details (indicate cue type and reason): declined need for practice of this transfer    Toileting - Clothing Manipulation Details (indicate cue type and reason): Educated on peri care technique without twisting and what she can use as a toilet aide if needed.  Tub/ Shower Transfer:  Tub transfer;Min guard;Shower Dealerseat;Rolling walker Tub/Shower Transfer Details (indicate cue type and reason): pt. able to to side step over tub but we had to reposition the walker placement due to how toilet and other furniture are arranged in the b.room.  reviewed that her boyfriend would need to be present at home during tub transfers to facilitate with walker stabilization Functional mobility during ADLs: Min guard;Rolling walker General ADL Comments: pt. reports she had implemented energy conservation tech. prior to this sx. due to hx. of previous sx.  states b.friend is available to assist with LB ADLS as needed      Vision                     Perception     Praxis      Cognition   Behavior During Therapy: [facility] for tasks assessed/performed Overall Cognitive Status: Within Functional Limits for tasks assessed                       Extremity/Trunk Assessment               Exercises     Shoulder Instructions       General Comments      Pertinent Vitals/ Pain       Pain Assessment: 0-10 Pain Score: 8  Pain Location: back Pain Descriptors / Indicators: Aching Pain Intervention(s): Premedicated before session;Repositioned;Monitored during session;Limited activity within patient's tolerance  Home Living                                          Prior Functioning/Environment              Frequency           Progress Toward Goals  OT Goals(current goals can now be found in the care plan section)  Progress towards OT goals: Progressing toward goals  Acute Rehab OT Goals Patient Stated Goal: home at d/c  Plan Discharge plan remains appropriate    Co-evaluation                 End of Session Equipment Utilized During Treatment: Gait belt;Rolling walker   Activity Tolerance Patient tolerated treatment well   Patient Left in chair;with call bell/phone within reach   Nurse Communication          Time:  1610-9604 OT Time Calculation (min): 19 min  Charges: OT General Charges $OT Visit: 1 Procedure OT Treatments $Self Care/Home Management : 8-22 mins  Robet Leu, COTA/L 10/08/2016, 10:20 AM

## 2016-10-08 NOTE — Progress Notes (Signed)
Pt seen and examined. No issues overnight.  Patient states she cannot go home because pain is not adequately controlled.  EXAM: Temp:  [98.5 F (36.9 C)-98.7 F (37.1 C)] 98.6 F (37 C) (01/07 0530) Pulse Rate:  [96-113] 97 (01/07 1010) Resp:  [18-20] 18 (01/07 1010) BP: (104-118)/(56-76) 104/59 (01/07 1010) SpO2:  [96 %-99 %] 98 % (01/07 1010) Intake/Output      01/06 0701 - 01/07 0700 01/07 0701 - 01/08 0700   P.O. 240    I.V. 1738.8    Total Intake 1978.8     Urine     Blood     Total Output       Net +1978.8          Urine Occurrence 2 x     Awake and alert Follows commands throughout Full strength  Stable Continue current care Add oxycodone IR for breakthrough pain

## 2016-10-09 MED ORDER — OXYCODONE HCL 5 MG PO TABS: 5.0000 mg | ORAL_TABLET | ORAL | 0 refills | Status: DC | PRN

## 2016-10-09 MED ORDER — OXYCODONE-ACETAMINOPHEN 5-325 MG PO TABS: 1.0000 | ORAL_TABLET | ORAL | 0 refills | Status: DC | PRN

## 2016-10-09 NOTE — Progress Notes (Signed)
Patient has not had increased complaints of pain this shift until significant other arrived and patient starts to complain of excruciating pain less than one hour after pain medication has been administered. Inquired of patient what may have happened to initiate such severe pain. Patient cannot pinpoint an exact event.  Discussed with significant other pain medication scheduling and alternative pain control measures such as application of cold.  Patient states the only reason why she wants to stay admitted another night is so significant other can clean house for safe ambulation.  Notified boyfriend that if he needs to clean house for discharge, patient will be cared for fully by hospital staff. Verbalizes understanding. Will continue to monitor. [place] RadarHeather M Yasmin Bronaugh

## 2016-10-09 NOTE — Progress Notes (Signed)
Discussed alternative pain control measures with patient such as deep breathing, progressive relaxation and visualization.  Patient states she suffers from anxiety and panic attacks and wishes to control symptoms without medications. Darlene Frazier

## 2016-10-09 NOTE — Progress Notes (Signed)
No issues overnight. Pt cont to report back and left leg pain, but improved with addition of OxyIR.   EXAM:  BP 124/76 (BP Location: Left Arm)   Pulse (!) 117   Temp 98.1 F (36.7 C) (Oral)   Resp 18   LMP  (Approximate)   SpO2 99%   Awake, alert, oriented  Speech fluent, appropriate  CN grossly intact  5/5 BUE/BLE  Wound c/d/i  IMPRESSION:  36 y.o. female POD# 3 s/p L5-S1 fusion, progressing as expected.  PLAN: - Likely d/c later today

## 2016-10-09 NOTE — Discharge Summary (Signed)
Physician Discharge Summary  Patient ID: Darlene Frazier MRN: [id number] DOB/AGE: 38/01/1981 35 y.o.  Admit date: 10/06/2016 Discharge date: 10/09/2016  Admission Diagnoses:  Lumbago with radiculopathy  Discharge Diagnoses:  Same Active Problems:   S/P lumbar fusion   Discharged Condition: Stable  Hospital Course:  Darlene Frazier is a 36 y.o. female electively admitted after uncomplicated lumbar fusion. She had an uneventful hospital course, was ambulating well, tolerating diet, voiding normally.  Treatments: Surgery - L5-S1 fusion  Discharge Exam: Blood pressure 124/76, pulse (!) 117, temperature 98.1 F (36.7 C), temperature source Oral, resp. rate 18, SpO2 99 %. Awake, alert, oriented Speech fluent, appropriate CN grossly intact 5/5 BUE/BLE Wound c/d/i  Disposition: 01-Home or Self Care  Discharge Instructions    Ambulatory referral to Physical Therapy    Complete by:  As directed      Allergies as of 10/09/2016      Reactions   Other Anaphylaxis, Hives   Grass and hay   Morphine And Related Other (See Comments)   Severe migraines in high doses      Medication List    STOP taking these medications   HYDROcodone-acetaminophen 5-325 MG tablet Commonly known as:  NORCO/VICODIN     TAKE these medications   bismuth subsalicylate 262 MG chewable tablet Commonly known as:  PEPTO BISMOL Chew 524 mg by mouth daily as needed for indigestion.   citalopram 20 MG tablet Commonly known as:  CELEXA Take 20 mg by mouth every evening.   cyclobenzaprine 10 MG tablet Commonly known as:  FLEXERIL Take 10-20 mg by mouth 2 (two) times daily as needed for muscle spasms.   DEXILANT 60 MG capsule Generic drug:  dexlansoprazole Take 60 mg by mouth every evening.   doxylamine (Sleep) 25 MG tablet Commonly known as:  UNISOM Take 75 mg by mouth at bedtime.   DULoxetine 60 MG capsule Commonly known as:  CYMBALTA Take 60 mg by mouth 2 (two) times daily.   EPINEPHrine  0.3 mg/0.3 mL Soaj injection Commonly known as:  EPI-PEN Inject 0.3 mg into the muscle as needed (allergic reaction).   gabapentin 300 MG capsule Commonly known as:  NEURONTIN Take 1,800 mg by mouth at bedtime.   GAS-X PO Take 1-2 tablets by mouth daily as needed (gas).   gemfibrozil 600 MG tablet Commonly known as:  LOPID Take 600 mg by mouth every evening.   hydrOXYzine 25 MG tablet Commonly known as:  ATARAX/VISTARIL Take 25 mg by mouth 2 (two) times daily as needed for itching.   ibuprofen 800 MG tablet Commonly known as:  ADVIL,MOTRIN Take 800 mg by mouth 2 (two) times daily as needed for pain.   LATUDA 60 MG Tabs Generic drug:  Lurasidone HCl Take 60 mg by mouth daily with supper.   levothyroxine 50 MCG tablet Commonly known as:  SYNTHROID, LEVOTHROID Take 50 mcg by mouth every evening.   loratadine 10 MG tablet Commonly known as:  CLARITIN Take 10 mg by mouth every evening.   LUBRICANT DROPS OP Apply 1 drop to eye daily as needed (dry eyes).   metoCLOPramide 10 MG tablet Commonly known as:  REGLAN Take 10 mg by mouth every evening.   metoprolol succinate 25 MG 24 hr tablet Commonly known as:  TOPROL-XL Take 25 mg by mouth daily.   ondansetron 4 MG tablet Commonly known as:  ZOFRAN Take 4 mg by mouth every 8 (eight) hours as needed for nausea or vomiting.   oxyCODONE 5 MG  immediate release tablet Commonly known as:  Oxy IR/ROXICODONE Take 1-2 tablets (5-10 mg total) by mouth every 4 (four) hours as needed for breakthrough pain.   oxyCODONE-acetaminophen 5-325 MG tablet Commonly known as:  PERCOCET/ROXICET Take 1-2 tablets by mouth every 4 (four) hours as needed for moderate pain.   RA FIBER-TAB 625 MG tablet Generic drug:  polycarbophil Take 1,875 mg by mouth at bedtime.   ranitidine 150 MG tablet Commonly known as:  ZANTAC Take 300 mg by mouth at bedtime.   SM CRANBERRY 300 MG tablet Generic drug:  Cranberry Take 600 mg by mouth at bedtime.    topiramate 50 MG tablet Commonly known as:  TOPAMAX Take 100 mg by mouth every evening.      Follow-up Information    Minela Bridgewater, C, MD Follow up in 3 week(s).   Specialty:  Neurosurgery Contact information: [address] [place] [address] [phone]           Signed: Lisbeth Renshaw, Salena Saner 10/09/2016, 8:40 AM

## 2016-10-09 NOTE — Care Management Note (Signed)
Case Management Note  Patient Details  Name: Darlene Frazier MRN: [id number] Date of Birth: 01/23/1981  Subjective/Objective:    Pt underwent lumbar surgery. She is from home with her boyfriend.                 Action/Plan: PT recommending outpatient therapy and DME. CM spoke to the patient and she feels she needs the DME at home. [facility]Shannon [facility] notified. CM following.  Expected Discharge Date:                  Expected Discharge Plan:  Home/Self Care  In-House Referral:     Discharge planning Services     Post Acute Care Choice:  Durable Medical Equipment Choice offered to:  Patient  DME Arranged:  3-N-1, Walker rolling DME Agency:  [facility]  HH Arranged:    HH Agency:     Status of Service:  In process, will continue to follow  If discussed at Long Length of Stay Meetings, dates discussed:    Additional Comments:  Kermit BaloKelli F Nimisha Rathel, RN 10/09/2016, 2:15 PM

## 2016-10-09 NOTE — Progress Notes (Signed)
Occupational Therapy Treatment Patient Details Name: Darlene Frazier MRN: [id number] DOB: 04/23/1981 Today's Date: 10/09/2016    History of present illness Pt is a 36 y/o female who presents s/p L5-S1 posterior lumbar fusion on 10/06/16.   OT comments  Pt progressing toward OT goals. Pt educated on compensatory ADL strategies for standing grooming tasks at sink. She was able to complete with supervision for safety. She continues to move slowly and report severe pain but was able to maintain back precautions throughout session. Additionally continued discussion concerning use of AE for toileting hygeine. D/C plan remains appropriate. OT will continue to follow acutely.   Follow Up Recommendations  No OT follow up;Supervision/Assistance - 24 hour (initially)    Equipment Recommendations  3 in 1 bedside commode    Recommendations for Other Services      Precautions / Restrictions Precautions Precautions: Fall;Back Precaution Booklet Issued: Yes (comment) Precaution Comments: Able to recall 2/3 at beginning of session. Reviewed with pt. Required Braces or Orthoses: Spinal Brace Spinal Brace: Lumbar corset;Applied in sitting position Restrictions Weight Bearing Restrictions: No       Mobility Bed Mobility               General bed mobility comments: OOB in recliner on OT arrival.  Transfers Overall transfer level: Needs assistance Equipment used: Rolling walker (2 wheeled) Transfers: Sit to/from Stand Sit to Stand: Supervision         General transfer comment: Pt requires increased time.    Balance Overall balance assessment: Needs assistance Sitting-balance support: Feet supported;No upper extremity supported Sitting balance-Leahy Scale: Good     Standing balance support: No upper extremity supported;During functional activity Standing balance-Leahy Scale: Fair Standing balance comment: Able to stand at sink for ADL without UE support.                    ADL Overall ADL's : Needs assistance/impaired     Grooming: Oral care;Min guard;Standing           Upper Body Dressing : Minimal assistance;Sitting Upper Body Dressing Details (indicate cue type and reason): Min assist to don brace.     Toilet Transfer: Ambulation;BSC;RW;Supervision/safety           Functional mobility during ADLs: Rolling walker;Supervision/safety General ADL Comments: Pt continues to move slowly with all mobility due to pain. Able to maintain back precautions and educated on compensatory ADL strategies. Additionally discussed fall prevention and energy conservation strategies.      Vision                     Perception     Praxis      Cognition   Behavior During Therapy: WFL for tasks assessed/performed Overall Cognitive Status: Within Functional Limits for tasks assessed                       Extremity/Trunk Assessment               Exercises     Shoulder Instructions       General Comments      Pertinent Vitals/ Pain       Pain Assessment: 0-10 Pain Score: 8  Pain Location: back Pain Descriptors / Indicators: Aching Pain Intervention(s): Limited activity within patient's tolerance;Monitored during session;Repositioned  Home Living  Prior Functioning/Environment              Frequency  Min 2X/week        Progress Toward Goals  OT Goals(current goals can now be found in the care plan section)  Progress towards OT goals: Progressing toward goals  Acute Rehab OT Goals Patient Stated Goal: home at d/c OT Goal Formulation: With patient Time For Goal Achievement: 10/21/16 Potential to Achieve Goals: Good ADL Goals Pt Will Perform Grooming: with supervision;standing Pt Will Perform Lower Body Bathing: with supervision;sit to/from stand (with or without AE) Pt Will Perform Lower Body Dressing: with supervision;sit to/from stand (with or  without AE) Pt Will Transfer to Toilet: with supervision;ambulating;bedside commode (over toilet) Pt Will Perform Toileting - Clothing Manipulation and hygiene: with supervision;sit to/from stand Pt Will Perform Tub/Shower Transfer: Tub transfer;with supervision;ambulating;shower seat;rolling walker Additional ADL Goal #1: Pt will independently verbally recall 3/3 back precautions and maintain throghout ADL. Additional ADL Goal #2: Pt will don/doff back brace with set up as precursor to ADL and functional mobility.  Plan Discharge plan remains appropriate    Co-evaluation                 End of Session Equipment Utilized During Treatment: Rolling walker;Back brace (Lumbar corset)   Activity Tolerance Patient tolerated treatment well   Patient Left in chair;with call bell/phone within reach   Nurse Communication          Time: 1610-9604 OT Time Calculation (min): 17 min  Charges: OT General Charges $OT Visit: 1 Procedure OT Treatments $Self Care/Home Management : 8-22 mins  Doristine Section, OTR/L [phone] 10/09/2016, 11:06 AM

## 2016-10-09 NOTE — Progress Notes (Signed)
Physical Therapy Treatment Patient Details Name: Darlene Frazier MRN: [id number] DOB: 07/08/1981 Today's Date: 10/09/2016    History of Present Illness Pt is a 36 y/o female who presents s/p L5-S1 posterior lumbar fusion on 10/06/16.    PT Comments    Pt progressing slowly towards physical therapy goals. Was able to perform transfers and ambulation with gross supervision for safety. Pain continues to be the main limiting factor as mobility progresses (mainly in L low back and down L LE). Will continue to follow and progress as able per POC.   Follow Up Recommendations  Outpatient PT     Equipment Recommendations  Rolling walker with 5" wheels    Recommendations for Other Services       Precautions / Restrictions Precautions Precautions: Fall;Back Precaution Booklet Issued: Yes (comment) Precaution Comments: Able to recall 3/3 at beginning of session. Reviewed with pt. Required Braces or Orthoses: Spinal Brace Spinal Brace: Lumbar corset;Applied in sitting position Restrictions Weight Bearing Restrictions: No    Mobility  Bed Mobility Overal bed mobility: Needs Assistance Bed Mobility: Rolling;Sit to Sidelying Rolling: Modified independent (Device/Increase time) Sidelying to sit: Supervision     Sit to sidelying: Min guard General bed mobility comments: OOB in recliner on PT arrival  Transfers Overall transfer level: Needs assistance Equipment used: Rolling walker (2 wheeled) Transfers: Sit to/from Stand Sit to Stand: Supervision         General transfer comment: Increased time due to pain. Pt was able to power-up to full standing position without physical assistance.   Ambulation/Gait Ambulation/Gait assistance: Supervision Ambulation Distance (Feet): 175 Feet Assistive device: Rolling walker (2 wheeled) Gait Pattern/deviations: Step-through pattern;Decreased stride length Gait velocity: Decreased Gait velocity interpretation: Below normal speed for  age/gender General Gait Details: verbal cues for posture and walker placement close to the pt's body.    Stairs            Wheelchair Mobility    Modified Rankin (Stroke Patients Only)       Balance Overall balance assessment: Needs assistance Sitting-balance support: Feet supported;No upper extremity supported Sitting balance-Leahy Scale: Good     Standing balance support: No upper extremity supported;During functional activity Standing balance-Leahy Scale: Fair Standing balance comment: Able to stand at sink for ADL without UE support.                    Cognition Arousal/Alertness: Awake/alert Behavior During Therapy: WFL for tasks assessed/performed Overall Cognitive Status: Within Functional Limits for tasks assessed                      Exercises      General Comments        Pertinent Vitals/Pain Pain Assessment: 0-10 Pain Score: 8  Pain Location: back Pain Descriptors / Indicators: Aching Pain Intervention(s): Limited activity within patient's tolerance;Monitored during session;Repositioned    Home Living                      Prior Function            PT Goals (current goals can now be found in the care plan section) Acute Rehab PT Goals Patient Stated Goal: home at d/c PT Goal Formulation: With patient Time For Goal Achievement: 10/14/16 Potential to Achieve Goals: Good Progress towards PT goals: Progressing toward goals    Frequency    Min 5X/week      PT Plan Current plan remains appropriate    Co-evaluation  End of Session Equipment Utilized During Treatment: Gait belt;Back brace Activity Tolerance: Patient tolerated treatment well Patient left: in chair;with call bell/phone within reach     Time: 1000-1018 PT Time Calculation (min) (ACUTE ONLY): 18 min  Charges:  $Gait Training: 8-22 mins                    G Codes:      Marylynn Pearson 04-Nov-2016, 1:56 PM   Conni Slipper, PT,  DPT Acute Rehabilitation Services Pager: [phone]

## 2016-10-10 ENCOUNTER — Encounter (HOSPITAL_COMMUNITY): Payer: Self-pay | Admitting: *Deleted

## 2016-10-10 NOTE — Clinical Social Work Note (Signed)
CSW consulted for New SNF. PT is recommending Outpatient PT. Pt is ready for discharge today. RNCM is following for discharge planning needs. CSW is signing off as no further needs identified.   Dede QuerySarah Avrey Flanagin, MSW, LCSW  Clinical Social Worker  [phone]

## 2016-10-10 NOTE — Care Management Note (Signed)
Case Management Note  Patient Details  Name: Darlene Frazier MRN: [id number] Date of Birth: 09/05/1981  Subjective/Objective:                    Action/Plan: Pt discharged home with her boyfriend. She had 3 n 1 and walker. No further needs per CM.   Expected Discharge Date:                  Expected Discharge Plan:  Home/Self Care  In-House Referral:     Discharge planning Services     Post Acute Care Choice:  Durable Medical Equipment Choice offered to:  Patient  DME Arranged:  3-N-1, Walker rolling DME Agency:  [facility]  HH Arranged:    HH Agency:     Status of Service:  Completed, signed off  If discussed at Long Length of Stay Meetings, dates discussed:    Additional Comments:  Kermit BaloKelli F Timmy Bubeck, RN 10/10/2016, 2:08 PM

## 2016-10-10 NOTE — Progress Notes (Signed)
No issues overnight. Had a "panic moment" yesterday, but okay now.   EXAM:  BP (!) 126/44 (BP Location: Left Arm)   Pulse (!) 118   Temp 98.4 F (36.9 C) (Oral)   Resp 18   LMP  (Approximate)   SpO2 97%   Awake, alert, oriented  Speech fluent, appropriate  CN grossly intact  5/5 BUE/BLE  Wound c/d/i  IMPRESSION:  36 y.o. female POD# 4 s/p lumbar decompression/fusion. Doing well  PLAN: - D/C home today

## 2016-10-10 NOTE — Progress Notes (Signed)
Physical Therapy Treatment Patient Details Name: Darlene Frazier MRN: [id number] DOB: September 20, 1981 Today's Date: 10/10/2016    History of Present Illness Pt is a 36 y/o female who presents s/p L5-S1 posterior lumbar fusion on 10/06/16.    PT Comments    Pt progressing towards physical therapy goals. Was able to demonstrate safe transfers this session however pain and fatigue are limiting safety and independence with ambulation. Pt practiced car transfer with boyfriend present for education. Pt anticipates d/c home this afternoon. Will continue to follow and progress as able per POC.   Follow Up Recommendations  Outpatient PT     Equipment Recommendations  Rolling walker with 5" wheels;3in1 (PT)    Recommendations for Other Services       Precautions / Restrictions Precautions Precautions: Fall;Back Precaution Booklet Issued: Yes (comment) Precaution Comments: Able to recall 3/3 at beginning of session. Reviewed with pt. Required Braces or Orthoses: Spinal Brace Spinal Brace: Lumbar corset;Applied in sitting position Restrictions Weight Bearing Restrictions: No    Mobility  Bed Mobility Overal bed mobility: Needs Assistance Bed Mobility: Rolling;Sidelying to Sit;Sit to Sidelying Rolling: Modified independent (Device/Increase time) Sidelying to sit: Modified independent (Device/Increase time)     Sit to sidelying: Modified independent (Device/Increase time) General bed mobility comments: HOB flat and rails lowered to simulate home environment.   Transfers Overall transfer level: Modified independent Equipment used: Rolling walker (2 wheeled) Transfers: Sit to/from Stand           General transfer comment: Increased time due to pain. Pt was able to power-up to full standing position without physical assistance.   Ambulation/Gait Ambulation/Gait assistance: Supervision Ambulation Distance (Feet): 125 Feet Assistive device: Rolling walker (2 wheeled) Gait  Pattern/deviations: Step-through pattern;Decreased stride length Gait velocity: Decreased Gait velocity interpretation: Below normal speed for age/gender General Gait Details: Pt ambulated in hall ~125 feet and required seated rest due to fatigue and pain.   Stairs            Wheelchair Mobility    Modified Rankin (Stroke Patients Only)       Balance Overall balance assessment: Needs assistance Sitting-balance support: Feet supported;No upper extremity supported Sitting balance-Leahy Scale: Good     Standing balance support: No upper extremity supported;During functional activity Standing balance-Leahy Scale: Fair                      Cognition Arousal/Alertness: Awake/alert Behavior During Therapy: WFL for tasks assessed/performed Overall Cognitive Status: Within Functional Limits for tasks assessed                      Exercises      General Comments General comments (skin integrity, edema, etc.): Practiced car transfer. Simualted with tub bench and tub in neuro gym.       Pertinent Vitals/Pain Pain Assessment: Faces Faces Pain Scale: Hurts even more Pain Location: back Pain Descriptors / Indicators: Operative site guarding;Grimacing Pain Intervention(s): Limited activity within patient's tolerance;Monitored during session;Repositioned    Home Living                      Prior Function            PT Goals (current goals can now be found in the care plan section) Acute Rehab PT Goals Patient Stated Goal: home at d/c PT Goal Formulation: With patient Time For Goal Achievement: 10/14/16 Potential to Achieve Goals: Good Progress towards PT goals: Progressing toward goals  Frequency    Min 5X/week      PT Plan Current plan remains appropriate    Co-evaluation             End of Session Equipment Utilized During Treatment: Gait belt;Back brace Activity Tolerance: Patient tolerated treatment well Patient left:  in chair;with call bell/phone within reach     Time: 1027-1055 PT Time Calculation (min) (ACUTE ONLY): 28 min  Charges:  $Gait Training: 23-37 mins                    G Codes:      Marylynn PearsonLaura D Bernice Mullin 10/10/2016, 1:11 PM   Conni SlipperLaura Montray Kliebert, PT, DPT Acute Rehabilitation Services Pager: [phone]

## 2016-10-10 NOTE — Care Management Important Message (Signed)
Important Message  Patient Details  Name: NiccolinaReita Chard L Gangwer MRN: [id number] Date of Birth: 07/18/1981   Medicare Important Message Given:  Yes    Michiah Mudry 10/10/2016, 11:04 AM

## 2016-12-21 ENCOUNTER — Encounter: Payer: Self-pay | Admitting: Physical Therapy

## 2016-12-21 ENCOUNTER — Ambulatory Visit: Payer: Medicare Other | Attending: Neurosurgery | Admitting: Physical Therapy

## 2016-12-21 DIAGNOSIS — R262 Difficulty in walking, not elsewhere classified: Secondary | ICD-10-CM

## 2016-12-21 DIAGNOSIS — M62830 Muscle spasm of back: Secondary | ICD-10-CM | POA: Insufficient documentation

## 2016-12-21 DIAGNOSIS — M5442 Lumbago with sciatica, left side: Secondary | ICD-10-CM | POA: Insufficient documentation

## 2016-12-21 NOTE — Therapy (Signed)
[facility] [address] [place], [place], [address] Phone: [phone]   Fax:  [phone]  Physical Therapy Evaluation  Patient Details  Name: Darlene Frazier MRN: [id number] Date of Birth: [birth year] Referring Provider: Conchita Paris  Encounter Date: 12/21/2016      PT End of Session - 12/21/16 1334    Visit Number 1   Date for PT Re-Evaluation 02/20/17   PT Start Time 1305   PT Stop Time 1355   PT Time Calculation (min) 50 min   Activity Tolerance Patient tolerated treatment well   Behavior During Therapy [facility] for tasks assessed/performed      Past Medical History:  Diagnosis Date  . Arthritis   . Asthma    exercised induced  . Bipolar 1 disorder (HCC)   . Borderline personality disorder   . Depression   . Fibromyalgia   . GERD (gastroesophageal reflux disease)   . H/O hiatal hernia   . Headache(784.0)    treated with Topamax  . Hypertension   . Hypothyroidism   . Insomnia   . Pneumonia    "walking" pneumonia  . Renal disorder    kidney stones with stent  . Snores   . Tachycardia   . Thyroid disease     Past Surgical History:  Procedure Laterality Date  . BREAST SURGERY Right    biopsy  . CHOLECYSTECTOMY    . ESOPHAGOGASTRODUODENOSCOPY N/A 11/16/2015   Procedure: ESOPHAGOGASTRODUODENOSCOPY (EGD);  Surgeon: Dorena Cookey, MD;  Location: [facility] ENDOSCOPY;  Service: Endoscopy;  Laterality: N/A;  . FOOT SURGERY Left 1999   nodule removed  . kidney stent  2007   placement and removal  . LUMBAR LAMINECTOMY/DECOMPRESSION MICRODISCECTOMY Left 06/03/2013   Procedure: LEFT LUMBAR FIVE SACRAL ONE LAMINECTOMY/DECOMPRESSION MICRODISCECTOMY ;  Surgeon: Lisbeth Renshaw, MD;  Location: [facility] NEURO ORS;  Service: Neurosurgery;  Laterality: Left;  LEFT L5S1 microdiskectomy    There were no vitals filed for this visit.       Subjective Assessment - 12/21/16 1309    Subjective Patient underwent a Lumbar suregery at  L5-S1 fusion on 10/06/16.  She reports that she had a previous surgery in 2014.  She reports feeling better since this last surgery.  She was told to walk, she reports walking a little but not much.  Reports that she is still using a motorizewd cart for shopping.     Limitations Walking;Standing;House hold activities   How long can you stand comfortably? 1 minute   How long can you walk comfortably? 150 feet max and then has to stop and rest   Patient Stated Goals move better, walk without a cane   Currently in Pain? Yes   Pain Score 3    Pain Location Back   Pain Orientation Lower;Left   Pain Descriptors / Indicators Aching;Sore;Tender   Pain Type Surgical pain;Chronic pain   Pain Radiating Towards pain into the left buttock, the left lateral calf and the 4th and 5th toes.   Pain Onset More than a month ago   Pain Frequency Constant   Aggravating Factors  standing, walking, and bending pain is up to 8/10   Pain Relieving Factors rest, ice and pain medication 3 is about as good as it gets   Effect of Pain on Daily Activities limits everything            [facility] PT Assessment - 12/21/16 0001      Assessment   Medical Diagnosis lumbar fusion  L5-S1   Referring Provider Nundkumar   Onset Date/Surgical Date 10/06/16   Prior Therapy no     Precautions   Precautions Back   Precaution Comments 10# limit with lifting     Balance Screen   Has the patient fallen in the past 6 months Yes   How many times? 1   Has the patient had a decrease in activity level because of a fear of falling?  No   Is the patient reluctant to leave their home because of a fear of falling?  No     Home Environment   Additional Comments no stairs, has not been able to do housework      Prior Function   Level of Independence Independent with household mobility with device   Vocation On disability   Leisure no exercise     Posture/Postural Control   Posture Comments obese, fwd head, rounded shoulders      ROM / Strength   AROM / PROM / Strength AROM;Strength     AROM   Overall AROM Comments Lumbar ROM decreased 75%, hip flexion in standing was limited to 40 degrees due to weakness and pain in the back     Strength   Overall Strength Comments LE's 4-/5 with mild pain in the low back and in the left hip area     Flexibility   Soft Tissue Assessment /Muscle Length --  mild tightness in the LE's     Palpation   Palpation comment she is very tight with lumbar parapaspinals, the buttocks, ITB and calves     Ambulation/Gait   Gait Comments uses a SPC, slow, WBOS, toe out, 150 feet max distance, if longer she rests or uses an electric cart to sho     Standardized Balance Assessment   Standardized Balance Assessment Timed Up and Go Test     Timed Up and Go Test   Normal TUG (seconds) 19                   [facility] Adult PT Treatment/Exercise - 12/21/16 0001      Transfers   Comments definite need to use hands to get up from sitting     Exercises   Exercises Lumbar     Lumbar Exercises: Aerobic   Elliptical NuStep Level 3 x 5 minutes     Lumbar Exercises: Seated   Long Arc Quad on Chair Both;1 set;10 reps   Other Seated Lumbar Exercises seated marches 10 reps each leg   Other Seated Lumbar Exercises on sit fit scapular stabilization                PT Education - 12/21/16 1334    Education provided Yes   Education Details seated LAQ's and marches   Person(s) Educated Patient   Methods Explanation;Demonstration   Comprehension Verbalized understanding          PT Short Term Goals - 12/21/16 1346      PT SHORT TERM GOAL #1   Title independent with initial HEP   Time 2   Period Weeks   Status New           PT Long Term Goals - 12/21/16 1346      PT LONG TERM GOAL #1   Title walk 500 feet with a SPC with 1 rest   Time 8   Period Weeks   Status New     PT LONG TERM GOAL #2   Title increase lumbar ROM  25%   Time 8   Period Weeks   Status  New     PT LONG TERM GOAL #3   Title decrease pain 50%   Period Weeks   Status New     PT LONG TERM GOAL #4   Title report able to stand 5 minutes   Time 8   Period Weeks   Status New               Plan - 12/21/16 1343    Clinical Impression Statement Patient underwent an L5-S1 lumbar fusion on 10/06/16, she had a surgery in 2014.  She is very limited in her ability to be mobile, she uses a cane and max distance walking is 150 feet, when shopping hse uses an electric cart.  She has limited lumbar motions, is very deconditioned.   Rehab Potential Fair   PT Frequency 2x / week   PT Duration 8 weeks   PT Treatment/Interventions ADLs/Self Care Home Management;Electrical Stimulation;Cryotherapy;Moist Heat;Therapeutic activities;Therapeutic exercise;Neuromuscular re-education;Patient/family education;Manual techniques   PT Next Visit Plan slowly get her moving, lumbar stabilization   Consulted and Agree with Plan of Care Patient      Patient will benefit from skilled therapeutic intervention in order to improve the following deficits and impairments:  Cardiopulmonary status limiting activity, Decreased activity tolerance, Decreased mobility, Decreased range of motion, Decreased strength, Difficulty walking, Increased muscle spasms, Impaired flexibility, Postural dysfunction, Improper body mechanics, Pain  Visit Diagnosis: Acute bilateral low back pain with left-sided sciatica - Plan: PT plan of care cert/re-cert  Muscle spasm of back - Plan: PT plan of care cert/re-cert  Difficulty in walking, not elsewhere classified - Plan: PT plan of care cert/re-cert      G-Codes - 12/21/16 1343    Functional Assessment Tool Used (Outpatient Only) foto 85% limitation   Functional Limitation Mobility: Walking and moving around   Mobility: Walking and Moving Around Current Status [phone]([id number]) At least 80 percent but less than 100 percent impaired, limited or restricted   Mobility: Walking and  Moving Around Goal Status [phone]([id number]) At least 60 percent but less than 80 percent impaired, limited or restricted       Problem List Patient Active Problem List   Diagnosis Date Noted  . S/P lumbar fusion 10/06/2016  . Cholelithiasis with acute or chronic cholecystitis 02/04/2013    Jearld LeschALBRIGHT,Yurika Pereda W., PT 12/21/2016, 1:50 PM  [facility] [address] [place], [place], [address] Phone: [phone]   Fax:  [phone]  Name: Darlene Frazier MRN: [id number] Date of Birth: 07/31/1981

## 2016-12-27 ENCOUNTER — Ambulatory Visit: Payer: Medicare Other | Admitting: Physical Therapy

## 2017-01-03 ENCOUNTER — Encounter: Payer: Self-pay | Admitting: Physical Therapy

## 2017-01-03 ENCOUNTER — Ambulatory Visit: Payer: Medicare Other | Attending: Neurosurgery | Admitting: Physical Therapy

## 2017-01-03 DIAGNOSIS — M62830 Muscle spasm of back: Secondary | ICD-10-CM

## 2017-01-03 DIAGNOSIS — R262 Difficulty in walking, not elsewhere classified: Secondary | ICD-10-CM | POA: Diagnosis present

## 2017-01-03 DIAGNOSIS — M5442 Lumbago with sciatica, left side: Secondary | ICD-10-CM | POA: Diagnosis present

## 2017-01-03 NOTE — Therapy (Signed)
[facility] [address] [place], [place], [address] Phone: [phone]   Fax:  [phone]  Physical Therapy Treatment  Patient Details  Name: Darlene Frazier MRN: [id number] Date of Birth: May 24, 1981 Referring Provider: Conchita Paris  Encounter Date: 01/03/2017      PT End of Session - 01/03/17 1642    Visit Number 2   Date for PT Re-Evaluation 02/20/17   PT Start Time 1600   PT Stop Time 1655   PT Time Calculation (min) 55 min   Activity Tolerance Patient tolerated treatment well   Behavior During Therapy [facility] for tasks assessed/performed      Past Medical History:  Diagnosis Date  . Arthritis   . Asthma    exercised induced  . Bipolar 1 disorder (HCC)   . Borderline personality disorder   . Depression   . Fibromyalgia   . GERD (gastroesophageal reflux disease)   . H/O hiatal hernia   . Headache(784.0)    treated with Topamax  . Hypertension   . Hypothyroidism   . Insomnia   . Pneumonia    "walking" pneumonia  . Renal disorder    kidney stones with stent  . Snores   . Tachycardia   . Thyroid disease     Past Surgical History:  Procedure Laterality Date  . BREAST SURGERY Right    biopsy  . CHOLECYSTECTOMY    . ESOPHAGOGASTRODUODENOSCOPY N/A 11/16/2015   Procedure: ESOPHAGOGASTRODUODENOSCOPY (EGD);  Surgeon: Dorena Cookey, MD;  Location: [facility]COPY;  Service: Endoscopy;  Laterality: N/A;  . FOOT SURGERY Left 1999   nodule removed  . kidney stent  2007   placement and removal  . LUMBAR LAMINECTOMY/DECOMPRESSION MICRODISCECTOMY Left 06/03/2013   Procedure: LEFT LUMBAR FIVE SACRAL ONE LAMINECTOMY/DECOMPRESSION MICRODISCECTOMY ;  Surgeon: Lisbeth Renshaw, MD;  Location: [facility] NEURO ORS;  Service: Neurosurgery;  Laterality: Left;  LEFT L5S1 microdiskectomy    There were no vitals filed for this visit.      Subjective Assessment - 01/03/17 1556    Subjective Pt reports that things have been going well.  No change since evaluation   Currently in Pain? Yes   Pain Score 4    Pain Location Back   Pain Orientation Left;Lower                         [facility] Adult PT Treatment/Exercise - 01/03/17 0001      Lumbar Exercises: Aerobic   Elliptical NuStep Level 3 x 6 minutes   UBE (Upper Arm Bike) lvl 3 58fwd/2bk     Lumbar Exercises: Standing   Row Both;10 reps;Theraband   Theraband Level (Row) Level 2 (Red)   Shoulder Extension 10 reps;Theraband   Theraband Level (Shoulder Extension) Level 2 (Red)   Other Standing Lumbar Exercises Seated Tband rows green 2x10      Lumbar Exercises: Seated   Long Arc Quad on Chair Both;10 reps;2 sets   LAQ on Chair Weights (lbs) 30   Sit to Stand 5 reps  2x5 UE sue    Other Seated Lumbar Exercises seated marches 10 reps 2 sets each leg 3lb; HS curls Red tband 2x15    Other Seated Lumbar Exercises lumbar extensions with black theraband 2x10     Modalities   Modalities Electrical Stimulation;Moist Heat     Moist Heat Therapy   Number Minutes Moist Heat 15 Minutes   Moist Heat Location Lumbar Spine  Emergency planning/management officer low back   Engineer, manufacturing [employer]   Electrical Stimulation Parameters supine to pt tolerance   Electrical Stimulation Goals Pain                  PT Short Term Goals - 12/21/16 1346      PT SHORT TERM GOAL #1   Title independent with initial HEP   Time 2   Period Weeks   Status New           PT Long Term Goals - 01/03/17 1642      PT LONG TERM GOAL #1   Title walk 500 feet with a SPC with 1 rest   Status On-going     PT LONG TERM GOAL #2   Title increase lumbar ROM 25%   Status On-going     PT LONG TERM GOAL #3   Title decrease pain 50%   Status On-going     PT LONG TERM GOAL #4   Title report able to stand 5 minutes   Status On-going               Plan - 01/03/17 1643    Clinical Impression Statement Pt tolerated an  initial progression to TE well. Does reports some knee pain after sit to stands. Pt also reports that she could feel a pull with standing rows and extensions. Pt fatigues quick with minium exertion requiring multiple rest breaks.   Rehab Potential Fair   PT Frequency 2x / week   PT Duration 8 weeks   PT Treatment/Interventions ADLs/Self Care Home Management;Electrical Stimulation;Cryotherapy;Moist Heat;Therapeutic activities;Therapeutic exercise;Neuromuscular re-education;Patient/family education;Manual techniques   PT Next Visit Plan slowly get her moving, lumbar stabilization      Patient will benefit from skilled therapeutic intervention in order to improve the following deficits and impairments:  Cardiopulmonary status limiting activity, Decreased activity tolerance, Decreased mobility, Decreased range of motion, Decreased strength, Difficulty walking, Increased muscle spasms, Impaired flexibility, Postural dysfunction, Improper body mechanics, Pain  Visit Diagnosis: Acute bilateral low back pain with left-sided sciatica  Muscle spasm of back  Difficulty in walking, not elsewhere classified     Problem List Patient Active Problem List   Diagnosis Date Noted  . S/P lumbar fusion 10/06/2016  . Cholelithiasis with acute or chronic cholecystitis 02/04/2013    Grayce Sessions, PTA 01/03/2017, 4:45 PM  [facility] [address] [place], [place], [address] Phone: [phone]   Fax:  [phone]  Name: BETZAYDA BRAXTON MRN: [id number] Date of Birth: [birth year]

## 2017-01-16 ENCOUNTER — Ambulatory Visit: Payer: Medicare Other | Admitting: Physical Therapy

## 2017-01-16 ENCOUNTER — Encounter: Payer: Self-pay | Admitting: Physical Therapy

## 2017-01-16 DIAGNOSIS — R262 Difficulty in walking, not elsewhere classified: Secondary | ICD-10-CM

## 2017-01-16 DIAGNOSIS — M62830 Muscle spasm of back: Secondary | ICD-10-CM

## 2017-01-16 DIAGNOSIS — M5442 Lumbago with sciatica, left side: Secondary | ICD-10-CM | POA: Diagnosis not present

## 2017-01-16 NOTE — Therapy (Signed)
[facility] [place] [facility] [address] [place], [place], [address] Phone: [phone]   Fax:  [phone]  Physical Therapy Treatment  Patient Details  Name: Darlene Frazier MRN: [id number] Date of Birth: September 30, 1981 Referring Provider: Conchita Paris  Encounter Date: 01/16/2017      PT End of Session - 01/16/17 1246    Visit Number 3   Date for PT Re-Evaluation 02/20/17   PT Start Time 1217   PT Stop Time 1315   PT Time Calculation (min) 58 min      Past Medical History:  Diagnosis Date  . Arthritis   . Asthma    exercised induced  . Bipolar 1 disorder (HCC)   . Borderline personality disorder   . Depression   . Fibromyalgia   . GERD (gastroesophageal reflux disease)   . H/O hiatal hernia   . Headache(784.0)    treated with Topamax  . Hypertension   . Hypothyroidism   . Insomnia   . Pneumonia    "walking" pneumonia  . Renal disorder    kidney stones with stent  . Snores   . Tachycardia   . Thyroid disease     Past Surgical History:  Procedure Laterality Date  . BREAST SURGERY Right    biopsy  . CHOLECYSTECTOMY    . ESOPHAGOGASTRODUODENOSCOPY N/A 11/16/2015   Procedure: ESOPHAGOGASTRODUODENOSCOPY (EGD);  Surgeon: Dorena Cookey, MD;  Location: [facility] ENDOSCOPY;  Service: Endoscopy;  Laterality: N/A;  . FOOT SURGERY Left 1999   nodule removed  . kidney stent  2007   placement and removal  . LUMBAR LAMINECTOMY/DECOMPRESSION MICRODISCECTOMY Left 06/03/2013   Procedure: LEFT LUMBAR FIVE SACRAL ONE LAMINECTOMY/DECOMPRESSION MICRODISCECTOMY ;  Surgeon: Lisbeth Renshaw, MD;  Location: [facility] NEURO ORS;  Service: Neurosurgery;  Laterality: Left;  LEFT L5S1 microdiskectomy    There were no vitals filed for this visit.      Subjective Assessment - 01/16/17 1219    Subjective muscle soreness after last session with some left leg pain   Currently in Pain? Yes   Pain Score 3    Pain Location Back                          [facility] Adult PT Treatment/Exercise - 01/16/17 0001      Lumbar Exercises: Aerobic   Elliptical L 4 6 min   UBE (Upper Arm Bike) lvl 3 24fwd/2bk     Lumbar Exercises: Machines for Strengthening   Cybex Lumbar Extension black band 2 sets 10   Other Lumbar Machine Exercise lat pull 20 # 2 sets 10     Lumbar Exercises: Standing   Other Standing Lumbar Exercises standing hip ext and abd red tband 10 times each     Lumbar Exercises: Seated   Other Seated Lumbar Exercises sit fit pelvic ROM 15 times  scap stab on sit fit   Other Seated Lumbar Exercises sit fit red tband LE ther ex     Lumbar Exercises: Supine   Ab Set 15 reps  with ball   Other Supine Lumbar Exercises bridge,KTC and obl with ball 15 each     Modalities   Modalities Electrical Stimulation;Moist Heat     Moist Heat Therapy   Number Minutes Moist Heat 15 Minutes   Moist Heat Location Lumbar Spine     Electrical Stimulation   Electrical Stimulation Location low back   Electrical Stimulation Action IFC   Electrical Stimulation Parameters supine  Electrical Stimulation Goals Pain                  PT Short Term Goals - 12/21/16 1346      PT SHORT TERM GOAL #1   Title independent with initial HEP   Time 2   Period Weeks   Status New           PT Long Term Goals - 01/03/17 1642      PT LONG TERM GOAL #1   Title walk 500 feet with a SPC with 1 rest   Status On-going     PT LONG TERM GOAL #2   Title increase lumbar ROM 25%   Status On-going     PT LONG TERM GOAL #3   Title decrease pain 50%   Status On-going     PT LONG TERM GOAL #4   Title report able to stand 5 minutes   Status On-going               Plan - 01/16/17 1247    Clinical Impression Statement pt tolerated increased core ex with minimal increase in pain, cuing for correct tech to avoid compensation and for correct breathing.   PT Next Visit Plan address goals, PROM/stretches.  HEP      Patient will benefit from skilled therapeutic intervention in order to improve the following deficits and impairments:  Cardiopulmonary status limiting activity, Decreased activity tolerance, Decreased mobility, Decreased range of motion, Decreased strength, Difficulty walking, Increased muscle spasms, Impaired flexibility, Postural dysfunction, Improper body mechanics, Pain  Visit Diagnosis: Acute bilateral low back pain with left-sided sciatica  Muscle spasm of back  Difficulty in walking, not elsewhere classified     Problem List Patient Active Problem List   Diagnosis Date Noted  . S/P lumbar fusion 10/06/2016  . Cholelithiasis with acute or chronic cholecystitis 02/04/2013    PAYSEUR,ANGIE APayseur PTA 01/16/2017, 12:53 PM  [facility] [place] [facility] [address] [place], [place], [address] Phone: [phone]   Fax:  [phone]  Name: Darlene Frazier MRN: [id number] Date of Birth: [birth year]

## 2017-01-23 ENCOUNTER — Ambulatory Visit: Payer: Medicare Other | Admitting: Physical Therapy

## 2017-01-23 ENCOUNTER — Encounter: Payer: Self-pay | Admitting: Physical Therapy

## 2017-01-23 DIAGNOSIS — M62830 Muscle spasm of back: Secondary | ICD-10-CM

## 2017-01-23 DIAGNOSIS — R262 Difficulty in walking, not elsewhere classified: Secondary | ICD-10-CM

## 2017-01-23 DIAGNOSIS — M5442 Lumbago with sciatica, left side: Secondary | ICD-10-CM

## 2017-01-23 NOTE — Therapy (Signed)
[facility] [place] [facility] [address] [place], [place], [address] Phone: [phone]   Fax:  [phone]  Physical Therapy Treatment  Patient Details  Name: Darlene Frazier MRN: [id number] Date of Birth: [birth year] Referring Provider: Conchita Paris  Encounter Date: 01/23/2017      PT End of Session - 01/23/17 1432    Visit Number 4   Date for PT Re-Evaluation 02/20/17   PT Start Time 1355   PT Stop Time 1450   PT Time Calculation (min) 55 min      Past Medical History:  Diagnosis Date  . Arthritis   . Asthma    exercised induced  . Bipolar 1 disorder (HCC)   . Borderline personality disorder   . Depression   . Fibromyalgia   . GERD (gastroesophageal reflux disease)   . H/O hiatal hernia   . Headache(784.0)    treated with Topamax  . Hypertension   . Hypothyroidism   . Insomnia   . Pneumonia    "walking" pneumonia  . Renal disorder    kidney stones with stent  . Snores   . Tachycardia   . Thyroid disease     Past Surgical History:  Procedure Laterality Date  . BREAST SURGERY Right    biopsy  . CHOLECYSTECTOMY    . ESOPHAGOGASTRODUODENOSCOPY N/A 11/16/2015   Procedure: ESOPHAGOGASTRODUODENOSCOPY (EGD);  Surgeon: Dorena Cookey, MD;  Location[facility] ENDOSCOPY;  Service: Endoscopy;  Laterality: N/A;  . FOOT SURGERY Left 1999   nodule removed  . kidney stent  2007   placement and removal  . LUMBAR LAMINECTOMY/DECOMPRESSION MICRODISCECTOMY Left 06/03/2013   Procedure: LEFT LUMBAR FIVE SACRAL ONE LAMINECTOMY/DECOMPRESSION MICRODISCECTOMY ;  Surgeon: Lisbeth Renshaw, MD;  Location: [facility] NEURO ORS;  Service: Neurosurgery;  Laterality: Left;  LEFT L5S1 microdiskectomy    There were no vitals filed for this visit.      Subjective Assessment - 01/23/17 1357    Subjective the patient reported that she is very tired and sore today and last week was a lot more than she is used to doing.    Currently in Pain? Yes   Pain Score 4     Pain Location Back   Pain Orientation Left;Lower   Pain Descriptors / Indicators Aching;Sore                         [facility] Adult PT Treatment/Exercise - 01/23/17 0001      Lumbar Exercises: Stretches   Single Knee to Chest Stretch 3 reps;30 seconds   Lower Trunk Rotation 3 reps;30 seconds     Lumbar Exercises: Aerobic   Elliptical L 4 6 min   UBE (Upper Arm Bike) L 3 3 min fwd/3 bk     Lumbar Exercises: Machines for Strengthening   Cybex Lumbar Extension black band 2 sets 10   Other Lumbar Machine Exercise row & lat pull 20 # 2 sets 10     Lumbar Exercises: Standing   Other Standing Lumbar Exercises little red ball overhead 2 x 8     Lumbar Exercises: Supine   Ab Set 15 reps  with ball   Other Supine Lumbar Exercises obl with red ball 2 x 10   Other Supine Lumbar Exercises PPT 2 x 10     Modalities   Modalities Electrical Stimulation;Moist Heat     Moist Heat Therapy   Number Minutes Moist Heat 15 Minutes   Moist Heat Location Lumbar  Spine     Programme researcher, broadcasting/film/video Location low back   Electrical Stimulation Action IFC   Electrical Stimulation Parameters supine   Electrical Stimulation Goals Pain                  PT Short Term Goals - 12/21/16 1346      PT SHORT TERM GOAL #1   Title independent with initial HEP   Time 2   Period Weeks   Status New           PT Long Term Goals - 01/03/17 1642      PT LONG TERM GOAL #1   Title walk 500 feet with a SPC with 1 rest   Status On-going     PT LONG TERM GOAL #2   Title increase lumbar ROM 25%   Status On-going     PT LONG TERM GOAL #3   Title decrease pain 50%   Status On-going     PT LONG TERM GOAL #4   Title report able to stand 5 minutes   Status On-going               Plan - 01/23/17 1433    Clinical Impression Statement Patient reported she was really tired, she is having muscle soreness she was not used to doing as much as she did  last week. The patient required cues to remember to breathe. Even though she was sore and in pain she wanted to do the exercises but not too many first then stretch and finish with estim. The patient tolerated the exercises very well with no increase in pain.    PT Next Visit Plan check status of goals based on patient's tolerance.      Patient will benefit from skilled therapeutic intervention in order to improve the following deficits and impairments:  Cardiopulmonary status limiting activity, Decreased activity tolerance, Decreased mobility, Decreased range of motion, Decreased strength, Difficulty walking, Increased muscle spasms, Impaired flexibility, Postural dysfunction, Improper body mechanics, Pain  Visit Diagnosis: Acute bilateral low back pain with left-sided sciatica  Muscle spasm of back  Difficulty in walking, not elsewhere classified     Problem List Patient Active Problem List   Diagnosis Date Noted  . S/P lumbar fusion 10/06/2016  . Cholelithiasis with acute or chronic cholecystitis 02/04/2013    Raquel James SPTA 01/23/2017, 2:50 PM  [facility] [place] [address] [place], [place], [address] Phone: [phone]   Fax:  [phone]  Name: Darlene Frazier MRN: [id number] Date of Birth: 04/24/1981

## 2017-01-24 ENCOUNTER — Emergency Department (HOSPITAL_COMMUNITY): Payer: Medicare Other

## 2017-01-24 ENCOUNTER — Emergency Department (HOSPITAL_COMMUNITY)
Admission: EM | Admit: 2017-01-24 | Discharge: 2017-01-25 | Disposition: A | Discharge status: Home / Self Care | Payer: Medicare Other | Attending: Physician Assistant | Admitting: Physician Assistant

## 2017-01-24 ENCOUNTER — Encounter (HOSPITAL_COMMUNITY): Payer: Self-pay | Admitting: Emergency Medicine

## 2017-01-24 DIAGNOSIS — I10 Essential (primary) hypertension: Secondary | ICD-10-CM | POA: Diagnosis not present

## 2017-01-24 DIAGNOSIS — J45909 Unspecified asthma, uncomplicated: Secondary | ICD-10-CM | POA: Diagnosis not present

## 2017-01-24 DIAGNOSIS — N202 Calculus of kidney with calculus of ureter: Secondary | ICD-10-CM | POA: Insufficient documentation

## 2017-01-24 DIAGNOSIS — N3000 Acute cystitis without hematuria: Secondary | ICD-10-CM

## 2017-01-24 DIAGNOSIS — R109 Unspecified abdominal pain: Secondary | ICD-10-CM | POA: Diagnosis present

## 2017-01-24 DIAGNOSIS — Z79899 Other long term (current) drug therapy: Secondary | ICD-10-CM | POA: Insufficient documentation

## 2017-01-24 DIAGNOSIS — E039 Hypothyroidism, unspecified: Secondary | ICD-10-CM | POA: Insufficient documentation

## 2017-01-24 DIAGNOSIS — N200 Calculus of kidney: Secondary | ICD-10-CM

## 2017-01-24 LAB — URINALYSIS, ROUTINE W REFLEX MICROSCOPIC
Bilirubin Urine: NEGATIVE
Glucose, UA: NEGATIVE mg/dL
KETONES UR: NEGATIVE mg/dL
Leukocytes, UA: NEGATIVE
Nitrite: POSITIVE — AB
PH: 5 (ref 5.0–8.0)
Protein, ur: 30 mg/dL — AB
SPECIFIC GRAVITY, URINE: 1.017 (ref 1.005–1.030)

## 2017-01-24 LAB — CBC WITH DIFFERENTIAL/PLATELET
BASOS ABS: 0.1 10*3/uL (ref 0.0–0.1)
Basophils Relative: 1 %
EOS PCT: 2 %
Eosinophils Absolute: 0.1 10*3/uL (ref 0.0–0.7)
HCT: 36.1 % (ref 36.0–46.0)
HEMOGLOBIN: 11.7 g/dL — AB (ref 12.0–15.0)
LYMPHS ABS: 2.3 10*3/uL (ref 0.7–4.0)
LYMPHS PCT: 31 %
MCH: 28.4 pg (ref 26.0–34.0)
MCHC: 32.4 g/dL (ref 30.0–36.0)
MCV: 87.6 fL (ref 78.0–100.0)
MONO ABS: 0.4 10*3/uL (ref 0.1–1.0)
MONOS PCT: 6 %
Neutro Abs: 4.7 10*3/uL (ref 1.7–7.7)
Neutrophils Relative %: 60 %
PLATELETS: 449 10*3/uL — AB (ref 150–400)
RBC: 4.12 MIL/uL (ref 3.87–5.11)
RDW: 14.1 % (ref 11.5–15.5)
WBC: 7.6 10*3/uL (ref 4.0–10.5)

## 2017-01-24 LAB — COMPREHENSIVE METABOLIC PANEL
ALBUMIN: 4.5 g/dL (ref 3.5–5.0)
ALT: 38 U/L (ref 14–54)
AST: 27 U/L (ref 15–41)
Alkaline Phosphatase: 78 U/L (ref 38–126)
Anion gap: 8 (ref 5–15)
BILIRUBIN TOTAL: 0.5 mg/dL (ref 0.3–1.2)
BUN: 16 mg/dL (ref 6–20)
CO2: 22 mmol/L (ref 22–32)
Calcium: 9.3 mg/dL (ref 8.9–10.3)
Chloride: 107 mmol/L (ref 101–111)
Creatinine, Ser: 1.1 mg/dL — ABNORMAL HIGH (ref 0.44–1.00)
GFR calc Af Amer: >60 mL/min (ref >60)
GFR calc non Af Amer: >60 mL/min (ref >60)
Glucose, Bld: 113 mg/dL — ABNORMAL HIGH (ref 65–99)
POTASSIUM: 4 mmol/L (ref 3.5–5.1)
Sodium: 137 mmol/L (ref 135–145)
TOTAL PROTEIN: 8.1 g/dL (ref 6.5–8.1)

## 2017-01-24 LAB — PREGNANCY, URINE: PREG TEST UR: NEGATIVE

## 2017-01-24 MED ORDER — KETOROLAC TROMETHAMINE 30 MG/ML IJ SOLN
30.0000 mg | Freq: Once | INTRAMUSCULAR | Status: AC
  Administered 2017-01-24: 30 mg via INTRAVENOUS
  Filled 2017-01-24: qty 1

## 2017-01-24 MED ORDER — ONDANSETRON HCL 4 MG/2ML IJ SOLN
4.0000 mg | Freq: Once | INTRAMUSCULAR | Status: AC
  Administered 2017-01-24: 4 mg via INTRAVENOUS
  Filled 2017-01-24: qty 2

## 2017-01-24 MED ORDER — DEXTROSE 5 % IV SOLN
1.0000 g | Freq: Once | INTRAVENOUS | Status: AC
  Administered 2017-01-24: 1 g via INTRAVENOUS
  Filled 2017-01-24: qty 10

## 2017-01-24 NOTE — ED Provider Notes (Signed)
[facility]-EMERGENCY DEPT Provider Note   CSN: [id number] Arrival date & time: 01/24/17  1935     History   Chief Complaint Chief Complaint  Patient presents with  . Flank Pain  . Urinary Frequency    HPI Darlene Frazier is a 36 y.o. female.  HPI   Patient is a 36 year old female presenting with left flank pain and difficulty urinating. Patient reports trouble urinating intermittently. Patient has history of stones. Patient has been getting worse. She denies a fever. Mild nausea. Patient has resting tachycardia and is being seen by carrdiology for this.  Past Medical History:  Diagnosis Date  . Arthritis   . Asthma    exercised induced  . Bipolar 1 disorder (HCC)   . Borderline personality disorder   . Depression   . Fibromyalgia   . GERD (gastroesophageal reflux disease)   . H/O hiatal hernia   . Headache(784.0)    treated with Topamax  . Hypertension   . Hypothyroidism   . Insomnia   . Pneumonia    "walking" pneumonia  . Renal disorder    kidney stones with stent  . Snores   . Tachycardia   . Thyroid disease     Patient Active Problem List   Diagnosis Date Noted  . S/P lumbar fusion 10/06/2016  . Cholelithiasis with acute or chronic cholecystitis 02/04/2013    Past Surgical History:  Procedure Laterality Date  . BREAST SURGERY Right    biopsy  . CHOLECYSTECTOMY    . ESOPHAGOGASTRODUODENOSCOPY N/A 11/16/2015   Procedure: ESOPHAGOGASTRODUODENOSCOPY (EGD);  Surgeon: Dorena Cookey, MD;  Location: [facility] ENDOSCOPY;  Service: Endoscopy;  Laterality: N/A;  . FOOT SURGERY Left 1999   nodule removed  . kidney stent  2007   placement and removal  . LUMBAR LAMINECTOMY/DECOMPRESSION MICRODISCECTOMY Left 06/03/2013   Procedure: LEFT LUMBAR FIVE SACRAL ONE LAMINECTOMY/DECOMPRESSION MICRODISCECTOMY ;  Surgeon: Lisbeth Renshaw, MD;  Location: [facility] NEURO ORS;  Service: Neurosurgery;  Laterality: Left;  LEFT L5S1 microdiskectomy    OB History    Gravida Para Term Preterm AB  Living   0 0 0 0 0 0   SAB TAB Ectopic Multiple Live Births   0 0 0 0         Home Medications    Prior to Admission medications   Medication Sig Start Date End Date Taking? Authorizing Provider  bismuth subsalicylate (PEPTO BISMOL) 262 MG chewable tablet Chew 524 mg by mouth daily as needed for indigestion.   Yes Historical Provider, MD  citalopram (CELEXA) 20 MG tablet Take 20 mg by mouth every evening.   Yes Historical Provider, MD  Cranberry (SM CRANBERRY) 300 MG tablet Take 600 mg by mouth 2 (two) times daily.    Yes Historical Provider, MD  dexlansoprazole (DEXILANT) 60 MG capsule Take 60 mg by mouth every evening.   Yes Historical Provider, MD  doxylamine, Sleep, (UNISOM) 25 MG tablet Take 75 mg by mouth at bedtime.   Yes Historical Provider, MD  DULoxetine (CYMBALTA) 60 MG capsule Take 120 mg by mouth at bedtime.    Yes Historical Provider, MD  gabapentin (NEURONTIN) 300 MG capsule Take 1,800 mg by mouth at bedtime.    Yes Historical Provider, MD  gemfibrozil (LOPID) 600 MG tablet Take 600 mg by mouth every evening.    Yes Historical Provider, MD  levothyroxine (SYNTHROID, LEVOTHROID) 50 MCG tablet Take 50 mcg by mouth every evening.    Yes Historical Provider, MD  loratadine (CLARITIN) 10 MG tablet Take  10 mg by mouth every evening.   Yes Historical Provider, MD  Lurasidone HCl (LATUDA) 60 MG TABS Take 60 mg by mouth daily with supper.   Yes Historical Provider, MD  metoprolol succinate (TOPROL-XL) 25 MG 24 hr tablet Take 25 mg by mouth every evening.    Yes Historical Provider, MD  polycarbophil (RA FIBER-TAB) 625 MG tablet Take 1,875 mg by mouth at bedtime.    Yes Historical Provider, MD  ranitidine (ZANTAC) 150 MG tablet Take 300 mg by mouth at bedtime.   Yes Historical Provider, MD  tolterodine (DETROL LA) 2 MG 24 hr capsule Take 2 mg by mouth daily.   Yes Historical Provider, MD  topiramate (TOPAMAX) 50 MG tablet Take 100 mg by mouth every evening.    Yes Historical Provider,  MD  cyclobenzaprine (FLEXERIL) 10 MG tablet Take 10-20 mg by mouth 2 (two) times daily as needed for muscle spasms.    Historical Provider, MD  diazepam (VALIUM) 5 MG tablet Take 5 mg by mouth every 8 (eight) hours as needed for anxiety.  01/03/17   Historical Provider, MD  EPINEPHrine 0.3 mg/0.3 mL IJ SOAJ injection Inject 0.3 mg into the muscle as needed (allergic reaction).     Historical Provider, MD  hydrOXYzine (ATARAX/VISTARIL) 25 MG tablet Take 25 mg by mouth 2 (two) times daily as needed for itching.    Historical Provider, MD  ondansetron (ZOFRAN ODT) 4 MG disintegrating tablet Take 1 tablet (4 mg total) by mouth every 8 (eight) hours as needed for nausea or vomiting. 01/25/17   Aleeyah Bensen Lyn Rainbow Salman, MD  ondansetron (ZOFRAN) 4 MG tablet Take 4 mg by mouth every 8 (eight) hours as needed for nausea or vomiting.    Historical Provider, MD  oxyCODONE (OXY IR/ROXICODONE) 5 MG immediate release tablet Take 1-2 tablets (5-10 mg total) by mouth every 4 (four) hours as needed for breakthrough pain. Patient not taking: Reported on 01/24/2017 10/09/16   Lisbeth Renshaw, MD  oxyCODONE-acetaminophen (PERCOCET/ROXICET) 5-325 MG tablet Take 1-2 tablets by mouth every 4 (four) hours as needed for moderate pain. Patient not taking: Reported on 01/24/2017 10/09/16   Lisbeth Renshaw, MD  oxyCODONE-acetaminophen (PERCOCET/ROXICET) 5-325 MG tablet Take 1 tablet by mouth every 6 (six) hours as needed for severe pain. 01/25/17   Genaro Bekker Lyn Smith Potenza, MD  Polyvinyl Alcohol (LUBRICANT DROPS OP) Apply 1 drop to eye daily as needed (dry eyes).    Historical Provider, MD  Simethicone (GAS-X PO) Take 1-2 tablets by mouth daily as needed (gas).    Historical Provider, MD  tamsulosin (FLOMAX) 0.4 MG CAPS capsule Take 1 capsule (0.4 mg total) by mouth daily. 01/25/17   Alexea Blase Lyn Marybel Alcott, MD    Family History Family History  Problem Relation Age of Onset  . Hypertension Mother   . Bipolar disorder Mother   .  Hypertension Father   . Depression Brother   . Alcohol abuse Brother   . Bipolar disorder Brother     Social History Social History  Substance Use Topics  . Smoking status: Never Smoker  . Smokeless tobacco: Never Used  . Alcohol use Yes     Comment: socially     Allergies   Other and Morphine and related   Review of Systems Review of Systems  Constitutional: Negative for fatigue and fever.  Respiratory: Negative for cough.   Gastrointestinal: Positive for nausea. Negative for abdominal pain and vomiting.  Genitourinary: Positive for difficulty urinating and flank pain.     Physical  Exam Updated Vital Signs BP 136/87 (BP Location: Right Arm)   Pulse (!) 108   Temp 98.2 F (36.8 C) (Oral)   Resp 19   SpO2 96%   Physical Exam  Constitutional: She is oriented to person, place, and time. She appears well-developed and well-nourished.  HENT:  Head: Normocephalic and atraumatic.  Eyes: Right eye exhibits no discharge.  Cardiovascular: Normal rate, regular rhythm and normal heart sounds.   No murmur heard. Pulmonary/Chest: Effort normal and breath sounds normal. She has no wheezes. She has no rales.  Abdominal: Soft. She exhibits no distension. There is no tenderness.  L CVA tenderenss  Neurological: She is oriented to person, place, and time.  Skin: Skin is warm and dry. She is not diaphoretic.  Psychiatric: She has a normal mood and affect.  Nursing note and vitals reviewed.    ED Treatments / Results  Labs (all labs ordered are listed, but only abnormal results are displayed) Labs Reviewed  URINALYSIS, ROUTINE W REFLEX MICROSCOPIC - Abnormal; Notable for the following:       Result Value   Color, Urine AMBER (*)    Hgb urine dipstick LARGE (*)    Protein, ur 30 (*)    Nitrite POSITIVE (*)    Bacteria, UA RARE (*)    Squamous Epithelial / LPF 0-5 (*)    All other components within normal limits  COMPREHENSIVE METABOLIC PANEL - Abnormal; Notable for the  following:    Glucose, Bld 113 (*)    Creatinine, Ser 1.10 (*)    All other components within normal limits  CBC WITH DIFFERENTIAL/PLATELET - Abnormal; Notable for the following:    Hemoglobin 11.7 (*)    Platelets 449 (*)    All other components within normal limits  PREGNANCY, URINE    EKG  EKG Interpretation None       Radiology Ct Renal Stone Study  Result Date: 01/24/2017 CLINICAL DATA:  Chronic increased urinary urgency. Left flank pain and suprapubic pain. Initial encounter. EXAM: CT ABDOMEN AND PELVIS WITHOUT CONTRAST TECHNIQUE: Multidetector CT imaging of the abdomen and pelvis was performed following the standard protocol without IV contrast. COMPARISON:  CT of the abdomen and pelvis performed 11/13/2015 FINDINGS: Lower chest: The visualized lung bases are grossly clear. The visualized portions of the mediastinum are unremarkable. Hepatobiliary: Calcified granulomata are noted within the right hepatic lobe. There is diffuse fatty infiltration within the liver. The patient is status post cholecystectomy, with clips noted at the gallbladder fossa. The common bile duct remains normal in caliber. Pancreas: The pancreas is within normal limits. Spleen: The spleen is unremarkable in appearance. Adrenals/Urinary Tract: The adrenal glands are unremarkable in appearance. There is minimal left-sided hydronephrosis, with prominence of the left ureter along its entire course. An obstructing 4 x 3 mm stone is noted at the distal left ureter, just above the left vesicoureteral junction. Nonobstructing bilateral renal stones measure up to 5 mm in size. No perinephric stranding is seen. Stomach/Bowel: The stomach is unremarkable in appearance. The small bowel is within normal limits. The appendix is normal in caliber, without evidence of appendicitis. The colon is unremarkable in appearance. Vascular/Lymphatic: The abdominal aorta is unremarkable in appearance. The inferior vena cava is grossly  unremarkable. No retroperitoneal lymphadenopathy is seen. No pelvic sidewall lymphadenopathy is identified. Reproductive: The bladder is mildly distended and otherwise within normal limits. The uterus is grossly unremarkable in appearance. The ovaries are relatively symmetric. No suspicious adnexal masses are seen. Other: No additional  soft tissue abnormalities are seen. Musculoskeletal: No acute osseous abnormalities are identified. The patient is status post lumbosacral spinal fusion at L5-S1. The visualized musculature is unremarkable in appearance. IMPRESSION: 1. Minimal left-sided hydronephrosis, with obstructing 4 x 3 mm stone at the distal left ureter, just above the left vesicoureteral junction. 2. Nonobstructing bilateral renal stones measure up to 5 mm in size. 3. Diffuse fatty infiltration within the liver. Electronically Signed   By: Roanna Raider M.D.   On: 01/24/2017 23:02    Procedures Procedures (including critical care time)  Medications Ordered in ED Medications  ketorolac (TORADOL) 30 MG/ML injection 30 mg (30 mg Intravenous Given 01/24/17 2253)  ondansetron (ZOFRAN) injection 4 mg (4 mg Intravenous Given 01/24/17 2253)  cefTRIAXone (ROCEPHIN) 1 g in dextrose 5 % 50 mL IVPB (1 g Intravenous New Bag/Given 01/24/17 2357)     Initial Impression / Assessment and Plan / ED Course  I have reviewed the triage vital signs and the nursing notes.  Pertinent labs & imaging results that were available during my care of the patient were reviewed by me and considered in my medical decision making (see chart for details).     PT 36 year old female with past medical history significant for bipolar, fibromyalgia, anxiety, kidney stones presenting with left flank pain radiating to groin. Patient had intermittent difficulty urinating. No fevers. We will get urine, lab work, CT stone.  12:57 AM Patient has 4 mm stone in the left side. Patient has had urinary tract infection. We'll treat with  antibiotics. Discussed with urology. They recommend outpatient treatment and follow-up.  Patient is comfortable, ambulatory, and taking PO at time of discharge.  Patient expressed understanding about return precautions.    Final Clinical Impressions(s) / ED Diagnoses   Final diagnoses:  Kidney stone  Acute cystitis without hematuria    New Prescriptions New Prescriptions   ONDANSETRON (ZOFRAN ODT) 4 MG DISINTEGRATING TABLET    Take 1 tablet (4 mg total) by mouth every 8 (eight) hours as needed for nausea or vomiting.   OXYCODONE-ACETAMINOPHEN (PERCOCET/ROXICET) 5-325 MG TABLET    Take 1 tablet by mouth every 6 (six) hours as needed for severe pain.   TAMSULOSIN (FLOMAX) 0.4 MG CAPS CAPSULE    Take 1 capsule (0.4 mg total) by mouth daily.     Infantof Villagomez Randall An, MD 01/25/17 [id number]

## 2017-01-24 NOTE — ED Triage Notes (Signed)
Pt states for the past 5 weeks she has had the constant sensation that she needs to urinate  Pt saw her PCP and they told her she had no signs of infection  Pt has hx of kidney stones  Pt states she has left flank pain and pain in her suprapubic area  Pt states she has to strain and force herself to urinate  Pt has a urology appt next week but states the pain is increasing  Pt states he was given Tolterodine Tartrate ER 24hr  and generic AZO

## 2017-01-25 ENCOUNTER — Ambulatory Visit: Payer: Medicare Other | Admitting: Physical Therapy

## 2017-01-25 MED ORDER — TAMSULOSIN HCL 0.4 MG PO CAPS: 0.4000 mg | ORAL_CAPSULE | Freq: Every day | ORAL | 0 refills | Status: AC

## 2017-01-25 MED ORDER — SULFAMETHOXAZOLE-TRIMETHOPRIM 800-160 MG PO TABS: 1.0000 | ORAL_TABLET | Freq: Two times a day (BID) | ORAL | 0 refills | Status: DC

## 2017-01-25 MED ORDER — ONDANSETRON 4 MG PO TBDP: 4.0000 mg | ORAL_TABLET | Freq: Three times a day (TID) | ORAL | 0 refills | Status: AC | PRN

## 2017-01-25 MED ORDER — OXYCODONE-ACETAMINOPHEN 5-325 MG PO TABS: 1.0000 | ORAL_TABLET | Freq: Four times a day (QID) | ORAL | 0 refills | Status: DC | PRN

## 2017-01-25 NOTE — Discharge Instructions (Signed)
Please take  all medications prescribed. Please follow-up with urology. You should be seen in the next couple days by urology if possible.

## 2017-01-26 ENCOUNTER — Other Ambulatory Visit: Payer: Self-pay | Admitting: Urology

## 2017-01-26 ENCOUNTER — Encounter (HOSPITAL_BASED_OUTPATIENT_CLINIC_OR_DEPARTMENT_OTHER): Payer: Self-pay | Admitting: *Deleted

## 2017-01-26 NOTE — Progress Notes (Signed)
NPO AFTER MN.  ARRIVE AT 0600.  CURRENT LAB RESULTS AND EKG IN CHART AND EPIC.  WILL TAKE DEXILANT AND DETROL AM DOS W/ SIPS OF WATER AND IF NEEDED TAKE OXYCODON/ ZOFRAN.

## 2017-01-30 ENCOUNTER — Ambulatory Visit: Payer: Medicare Other | Admitting: Physical Therapy

## 2017-02-01 ENCOUNTER — Ambulatory Visit (HOSPITAL_BASED_OUTPATIENT_CLINIC_OR_DEPARTMENT_OTHER)
Admission: RE | Admit: 2017-02-01 | Discharge: 2017-02-01 | Disposition: A | Discharge status: Home / Self Care | Payer: Medicare Other | Source: Ambulatory Visit | Attending: Urology | Admitting: Urology

## 2017-02-01 ENCOUNTER — Encounter (HOSPITAL_BASED_OUTPATIENT_CLINIC_OR_DEPARTMENT_OTHER): Payer: Self-pay

## 2017-02-01 ENCOUNTER — Ambulatory Visit (HOSPITAL_BASED_OUTPATIENT_CLINIC_OR_DEPARTMENT_OTHER): Payer: Medicare Other | Admitting: Anesthesiology

## 2017-02-01 ENCOUNTER — Encounter (HOSPITAL_BASED_OUTPATIENT_CLINIC_OR_DEPARTMENT_OTHER)
Admission: RE | Disposition: A | Discharge status: Home / Self Care | Payer: Self-pay | Source: Ambulatory Visit | Attending: Urology

## 2017-02-01 ENCOUNTER — Ambulatory Visit: Payer: Medicare Other | Admitting: Physical Therapy

## 2017-02-01 DIAGNOSIS — M1990 Unspecified osteoarthritis, unspecified site: Secondary | ICD-10-CM | POA: Insufficient documentation

## 2017-02-01 DIAGNOSIS — E039 Hypothyroidism, unspecified: Secondary | ICD-10-CM | POA: Diagnosis not present

## 2017-02-01 DIAGNOSIS — I10 Essential (primary) hypertension: Secondary | ICD-10-CM | POA: Insufficient documentation

## 2017-02-01 DIAGNOSIS — G4700 Insomnia, unspecified: Secondary | ICD-10-CM | POA: Diagnosis not present

## 2017-02-01 DIAGNOSIS — F319 Bipolar disorder, unspecified: Secondary | ICD-10-CM | POA: Insufficient documentation

## 2017-02-01 DIAGNOSIS — Z79899 Other long term (current) drug therapy: Secondary | ICD-10-CM | POA: Diagnosis not present

## 2017-02-01 DIAGNOSIS — F603 Borderline personality disorder: Secondary | ICD-10-CM | POA: Diagnosis not present

## 2017-02-01 DIAGNOSIS — K219 Gastro-esophageal reflux disease without esophagitis: Secondary | ICD-10-CM | POA: Insufficient documentation

## 2017-02-01 DIAGNOSIS — Z885 Allergy status to narcotic agent status: Secondary | ICD-10-CM | POA: Diagnosis not present

## 2017-02-01 DIAGNOSIS — M797 Fibromyalgia: Secondary | ICD-10-CM | POA: Insufficient documentation

## 2017-02-01 DIAGNOSIS — N1330 Unspecified hydronephrosis: Secondary | ICD-10-CM | POA: Insufficient documentation

## 2017-02-01 DIAGNOSIS — J45909 Unspecified asthma, uncomplicated: Secondary | ICD-10-CM | POA: Insufficient documentation

## 2017-02-01 DIAGNOSIS — E785 Hyperlipidemia, unspecified: Secondary | ICD-10-CM | POA: Diagnosis not present

## 2017-02-01 DIAGNOSIS — Z79891 Long term (current) use of opiate analgesic: Secondary | ICD-10-CM | POA: Insufficient documentation

## 2017-02-01 DIAGNOSIS — K449 Diaphragmatic hernia without obstruction or gangrene: Secondary | ICD-10-CM | POA: Insufficient documentation

## 2017-02-01 HISTORY — DX: Diaphragmatic hernia without obstruction or gangrene: K44.9

## 2017-02-01 HISTORY — DX: Personal history of other diseases of the digestive system: Z87.19

## 2017-02-01 HISTORY — DX: Urgency of urination: R39.15

## 2017-02-01 HISTORY — DX: Irritable bowel syndrome, unspecified: K58.9

## 2017-02-01 HISTORY — DX: Personal history of urinary calculi: Z87.442

## 2017-02-01 HISTORY — DX: Frequency of micturition: R35.0

## 2017-02-01 HISTORY — DX: Exercise induced bronchospasm: J45.990

## 2017-02-01 HISTORY — DX: Other allergic rhinitis: J30.89

## 2017-02-01 HISTORY — DX: Calculus of ureter: N20.1

## 2017-02-01 HISTORY — DX: Unspecified abdominal pain: R10.9

## 2017-02-01 HISTORY — DX: Presence of spectacles and contact lenses: Z97.3

## 2017-02-01 HISTORY — DX: Migraine, unspecified, not intractable, without status migrainosus: G43.909

## 2017-02-01 HISTORY — DX: Hyperlipidemia, unspecified: E78.5

## 2017-02-01 HISTORY — DX: Calculus of kidney: N20.0

## 2017-02-01 HISTORY — PX: CYSTOSCOPY WITH RETROGRADE PYELOGRAM, URETEROSCOPY AND STENT PLACEMENT: SHX5789

## 2017-02-01 HISTORY — DX: Hyperprolactinemia: E22.1

## 2017-02-01 SURGERY — CYSTOURETEROSCOPY, WITH RETROGRADE PYELOGRAM AND STENT INSERTION
Anesthesia: General | Laterality: Left

## 2017-02-01 MED ORDER — ONDANSETRON HCL 4 MG/2ML IJ SOLN
INTRAMUSCULAR | Status: AC
  Filled 2017-02-01: qty 2

## 2017-02-01 MED ORDER — CEFAZOLIN SODIUM-DEXTROSE 2-4 GM/100ML-% IV SOLN
INTRAVENOUS | Status: AC
  Filled 2017-02-01: qty 100

## 2017-02-01 MED ORDER — IOHEXOL 300 MG/ML  SOLN
INTRAMUSCULAR | Status: DC | PRN
  Administered 2017-02-01: 7 mL

## 2017-02-01 MED ORDER — ROCURONIUM BROMIDE 100 MG/10ML IV SOLN
INTRAVENOUS | Status: DC | PRN
  Administered 2017-02-01: 20 mg via INTRAVENOUS

## 2017-02-01 MED ORDER — OXYCODONE-ACETAMINOPHEN 5-325 MG PO TABS: 1.0000 | ORAL_TABLET | Freq: Four times a day (QID) | ORAL | 0 refills | Status: AC | PRN

## 2017-02-01 MED ORDER — OXYCODONE-ACETAMINOPHEN 5-325 MG PO TABS
ORAL_TABLET | ORAL | Status: AC
  Filled 2017-02-01: qty 1

## 2017-02-01 MED ORDER — LIDOCAINE 2% (20 MG/ML) 5 ML SYRINGE
INTRAMUSCULAR | Status: AC
  Filled 2017-02-01: qty 5

## 2017-02-01 MED ORDER — CEFAZOLIN SODIUM-DEXTROSE 2-4 GM/100ML-% IV SOLN
2.0000 g | INTRAVENOUS | Status: AC
  Administered 2017-02-01: 3 g via INTRAVENOUS
  Filled 2017-02-01: qty 100

## 2017-02-01 MED ORDER — FENTANYL CITRATE (PF) 100 MCG/2ML IJ SOLN
INTRAMUSCULAR | Status: AC
Start: 2017-02-01 — End: 2017-02-01
  Filled 2017-02-01: qty 2

## 2017-02-01 MED ORDER — OXYCODONE-ACETAMINOPHEN 5-325 MG PO TABS
1.0000 | ORAL_TABLET | Freq: Once | ORAL | Status: AC
  Administered 2017-02-01: 1 via ORAL
  Filled 2017-02-01: qty 1

## 2017-02-01 MED ORDER — PROPOFOL 10 MG/ML IV BOLUS
INTRAVENOUS | Status: DC | PRN
  Administered 2017-02-01: 200 mg via INTRAVENOUS

## 2017-02-01 MED ORDER — FENTANYL CITRATE (PF) 100 MCG/2ML IJ SOLN
25.0000 ug | INTRAMUSCULAR | Status: DC | PRN
  Filled 2017-02-01: qty 1

## 2017-02-01 MED ORDER — LACTATED RINGERS IV SOLN
INTRAVENOUS | Status: DC
  Administered 2017-02-01 (×2): via INTRAVENOUS
  Filled 2017-02-01: qty 1000

## 2017-02-01 MED ORDER — CEFAZOLIN SODIUM-DEXTROSE 1-4 GM/50ML-% IV SOLN
1.0000 g | INTRAVENOUS | Status: DC
  Filled 2017-02-01: qty 50

## 2017-02-01 MED ORDER — SUGAMMADEX SODIUM 500 MG/5ML IV SOLN
INTRAVENOUS | Status: AC
  Filled 2017-02-01: qty 5

## 2017-02-01 MED ORDER — KETOROLAC TROMETHAMINE 30 MG/ML IJ SOLN
INTRAMUSCULAR | Status: AC
  Filled 2017-02-01: qty 1

## 2017-02-01 MED ORDER — MIDAZOLAM HCL 2 MG/2ML IJ SOLN
INTRAMUSCULAR | Status: AC
  Filled 2017-02-01: qty 2

## 2017-02-01 MED ORDER — ONDANSETRON HCL 4 MG/2ML IJ SOLN
INTRAMUSCULAR | Status: DC | PRN
  Administered 2017-02-01: 4 mg via INTRAVENOUS

## 2017-02-01 MED ORDER — SULFAMETHOXAZOLE-TRIMETHOPRIM 800-160 MG PO TABS: 1.0000 | ORAL_TABLET | Freq: Two times a day (BID) | ORAL | 0 refills | Status: DC

## 2017-02-01 MED ORDER — MIDAZOLAM HCL 5 MG/5ML IJ SOLN
INTRAMUSCULAR | Status: DC | PRN
  Administered 2017-02-01: 2 mg via INTRAVENOUS

## 2017-02-01 MED ORDER — DEXAMETHASONE SODIUM PHOSPHATE 4 MG/ML IJ SOLN
INTRAMUSCULAR | Status: DC | PRN
  Administered 2017-02-01: 10 mg via INTRAVENOUS

## 2017-02-01 MED ORDER — FENTANYL CITRATE (PF) 100 MCG/2ML IJ SOLN
INTRAMUSCULAR | Status: DC | PRN
  Administered 2017-02-01: 100 ug via INTRAVENOUS

## 2017-02-01 MED ORDER — SUGAMMADEX SODIUM 500 MG/5ML IV SOLN
INTRAVENOUS | Status: DC | PRN
  Administered 2017-02-01: 250 mg via INTRAVENOUS

## 2017-02-01 MED ORDER — SUCCINYLCHOLINE CHLORIDE 200 MG/10ML IV SOSY
PREFILLED_SYRINGE | INTRAVENOUS | Status: DC | PRN
  Administered 2017-02-01: 140 mg via INTRAVENOUS

## 2017-02-01 MED ORDER — SUCCINYLCHOLINE CHLORIDE 200 MG/10ML IV SOSY
PREFILLED_SYRINGE | INTRAVENOUS | Status: AC
  Filled 2017-02-01: qty 10

## 2017-02-01 MED ORDER — CEFAZOLIN SODIUM-DEXTROSE 1-4 GM/50ML-% IV SOLN
INTRAVENOUS | Status: AC
  Filled 2017-02-01: qty 50

## 2017-02-01 MED ORDER — PROPOFOL 10 MG/ML IV BOLUS
INTRAVENOUS | Status: AC
  Filled 2017-02-01: qty 40

## 2017-02-01 MED ORDER — KETOROLAC TROMETHAMINE 30 MG/ML IJ SOLN
INTRAMUSCULAR | Status: DC | PRN
  Administered 2017-02-01: 30 mg via INTRAVENOUS

## 2017-02-01 MED ORDER — PROMETHAZINE HCL 25 MG/ML IJ SOLN
6.2500 mg | INTRAMUSCULAR | Status: DC | PRN
  Filled 2017-02-01: qty 1

## 2017-02-01 MED ORDER — LIDOCAINE 2% (20 MG/ML) 5 ML SYRINGE
INTRAMUSCULAR | Status: DC | PRN
Start: 2017-02-01 — End: 2017-02-01
  Administered 2017-02-01: 100 mg via INTRAVENOUS

## 2017-02-01 MED ORDER — DEXAMETHASONE SODIUM PHOSPHATE 10 MG/ML IJ SOLN
INTRAMUSCULAR | Status: AC
  Filled 2017-02-01: qty 1

## 2017-02-01 SURGICAL SUPPLY — 31 items
BAG DRAIN URO-CYSTO SKYTR STRL (DRAIN) ×3 IMPLANT
BAG DRN UROCATH (DRAIN) ×1
BASKET DAKOTA 1.9FR 11X120 (BASKET) IMPLANT
BASKET LASER NITINOL 1.9FR (BASKET) IMPLANT
BASKET ZERO TIP NITINOL 2.4FR (BASKET) IMPLANT
BSKT STON RTRVL 120 1.9FR (BASKET)
BSKT STON RTRVL ZERO TP 2.4FR (BASKET)
CATH INTERMIT  6FR 70CM (CATHETERS) IMPLANT
CLOTH BEACON ORANGE TIMEOUT ST (SAFETY) ×3 IMPLANT
EVACUATOR MICROVAS BLADDER (UROLOGICAL SUPPLIES) IMPLANT
FIBER LASER FLEXIVA 1000 (UROLOGICAL SUPPLIES) IMPLANT
FIBER LASER FLEXIVA 200 (UROLOGICAL SUPPLIES) IMPLANT
FIBER LASER FLEXIVA 365 (UROLOGICAL SUPPLIES) IMPLANT
FIBER LASER FLEXIVA 550 (UROLOGICAL SUPPLIES) IMPLANT
FIBER LASER TRAC TIP (UROLOGICAL SUPPLIES) IMPLANT
GLOVE BIO SURGEON STRL SZ8 (GLOVE) ×3 IMPLANT
GOWN STRL REUS W/ TWL LRG LVL3 (GOWN DISPOSABLE) ×1 IMPLANT
GOWN STRL REUS W/ TWL XL LVL3 (GOWN DISPOSABLE) ×1 IMPLANT
GOWN STRL REUS W/TWL LRG LVL3 (GOWN DISPOSABLE) ×3
GOWN STRL REUS W/TWL XL LVL3 (GOWN DISPOSABLE) ×3
GUIDEWIRE ANG ZIPWIRE 038X150 (WIRE) ×3 IMPLANT
GUIDEWIRE STR DUAL SENSOR (WIRE) IMPLANT
IV NS IRRIG 3000ML ARTHROMATIC (IV SOLUTION) ×3 IMPLANT
KIT RM TURNOVER CYSTO AR (KITS) ×3 IMPLANT
MANIFOLD NEPTUNE II (INSTRUMENTS) IMPLANT
PACK CYSTO (CUSTOM PROCEDURE TRAY) ×3 IMPLANT
STENT URET 6FRX26 CONTOUR (STENTS) ×3 IMPLANT
SYRINGE 10CC LL (SYRINGE) ×3 IMPLANT
TUBE CONNECTING 12'X1/4 (SUCTIONS)
TUBE CONNECTING 12X1/4 (SUCTIONS) IMPLANT
TUBE FEEDING 8FR 16IN STR KANG (MISCELLANEOUS) IMPLANT

## 2017-02-01 NOTE — Transfer of Care (Signed)
Immediate Anesthesia Transfer of Care Note  Patient: Darlene Frazier  Procedure(s) Performed: Procedure(s) (LRB): CYSTOSCOPY WITH RETROGRADE PYELOGRAM, URETEROSCOPY AND STENT PLACEMENT (Left)  Patient Location: PACU  Anesthesia Type: General  Level of Consciousness: awake, oriented, sedated and patient cooperative  Airway & Oxygen Therapy: Patient Spontanous Breathing and Patient connected to face mask oxygen  Post-op Assessment: Report given to PACU RN and Post -op Vital signs reviewed and stable  Post vital signs: Reviewed and stable  Complications: No apparent anesthesia complications  Last Vitals:  Vitals:   02/01/17 0601 02/01/17 0849  BP: 138/83 (!) 139/92  Pulse: (!) 123 (!) 108  Resp: 18 12  Temp: 36.7 C 36.8 C    Last Pain:  Vitals:   02/01/17 0849  TempSrc:   PainSc: 2

## 2017-02-01 NOTE — Discharge Instructions (Signed)
Ureteral Stent Implantation, Care After Refer to this sheet in the next few weeks. These instructions provide you with information about caring for yourself after your procedure. Your health care provider may also give you more specific instructions. Your treatment has been planned according to current medical practices, but problems sometimes occur. Call your health care provider if you have any problems or questions after your procedure. What can I expect after the procedure? After the procedure, it is common to have:  Nausea.  Mild pain when you urinate. You may feel this pain in your lower back or lower abdomen. Pain should stop within a few minutes after you urinate. This may last for up to 1 week.  A small amount of blood in your urine for several days. Follow these instructions at home:   Medicines   Take over-the-counter and prescription medicines only as told by your health care provider.  If you were prescribed an antibiotic medicine, take it as told by your health care provider. Do not stop taking the antibiotic even if you start to feel better.  Do not drive for 24 hours if you received a sedative.  Do not drive or operate heavy machinery while taking prescription pain medicines. Activity   Return to your normal activities as told by your health care provider. Ask your health care provider what activities are safe for you.  Do not lift anything that is heavier than 10 lb (4.5 kg). Follow this limit for 1 week after your procedure, or for as long as told by your health care provider. General instructions   Watch for any blood in your urine. Call your health care provider if the amount of blood in your urine increases.  If you have a catheter:  Follow instructions from your health care provider about taking care of your catheter and collection bag.  Do not take baths, swim, or use a hot tub until your health care provider approves.  Drink enough fluid to keep your urine  clear or pale yellow.  Keep all follow-up visits as told by your health care provider. This is important. Contact a health care provider if:  You have pain that gets worse or does not get better with medicine, especially pain when you urinate.  You have difficulty urinating.  You feel nauseous or you vomit repeatedly during a period of more than 2 days after the procedure. Get help right away if:  Your urine is dark red or has blood clots in it.  You are leaking urine (have incontinence).  The end of the stent comes out of your urethra.  You cannot urinate.  You have sudden, sharp, or severe pain in your abdomen or lower back.  You have a fever. This information is not intended to replace advice given to you by your health care provider. Make sure you discuss any questions you have with your health care provider. Document Released: 05/21/2013 Document Revised: 02/24/2016 Document Reviewed: 04/02/2015 Elsevier Interactive Patient Education  2017 [employer]  REMOVE THE STENT IN 72 HOURS BY PULLING THE STRING   Post Anesthesia Home Care Instructions  Activity: Get plenty of rest for the remainder of the day. A responsible individual must stay with you for 24 hours following the procedure.  For the next 24 hours, DO NOT: -Drive a car -Advertising copywriter -Drink alcoholic beverages -Take any medication unless instructed by your physician -Make any legal decisions or sign important papers.  Meals: Start with liquid foods such as gelatin or  soup. Progress to regular foods as tolerated. Avoid greasy, spicy, heavy foods. If nausea and/or vomiting occur, drink only clear liquids until the nausea and/or vomiting subsides. Call your physician if vomiting continues.  Special Instructions/Symptoms: Your throat may feel dry or sore from the anesthesia or the breathing tube placed in your throat during surgery. If this causes discomfort, gargle with warm salt water. The discomfort  should disappear within 24 hours.  If you had a scopolamine patch placed behind your ear for the management of post- operative nausea and/or vomiting:  1. The medication in the patch is effective for 72 hours, after which it should be removed.  Wrap patch in a tissue and discard in the trash. Wash hands thoroughly with soap and water. 2. You may remove the patch earlier than 72 hours if you experience unpleasant side effects which may include dry mouth, dizziness or visual disturbances. 3. Avoid touching the patch. Wash your hands with soap and water after contact with the patch.

## 2017-02-01 NOTE — Anesthesia Postprocedure Evaluation (Signed)
Anesthesia Post Note  Patient: Darlene Frazier  Procedure(s) Performed: Procedure(s) (LRB): CYSTOSCOPY WITH RETROGRADE PYELOGRAM, URETEROSCOPY AND STENT PLACEMENT (Left)  Patient location during evaluation: PACU Anesthesia Type: General Level of consciousness: awake and alert Pain management: pain level controlled Vital Signs Assessment: post-procedure vital signs reviewed and stable Respiratory status: spontaneous breathing, nonlabored ventilation, respiratory function stable and patient connected to nasal cannula oxygen Cardiovascular status: blood pressure returned to baseline and stable Postop Assessment: no signs of nausea or vomiting Anesthetic complications: no       Last Vitals:  Vitals:   02/01/17 0915 02/01/17 0930  BP: (!) 111/54 113/67  Pulse: (!) 108 (!) 103  Resp: 10 14  Temp:      Last Pain:  Vitals:   02/01/17 0945  TempSrc:   PainSc: 1                  Cecile HearingStephen Edward Turk

## 2017-02-01 NOTE — Op Note (Signed)
Preoperative diagnosis: Left hydronephrosis  Postoperative diagnosis: Same  Procedure: 1 cystoscopy 2.  left retrograde pyelography 3.  Intraoperative fluoroscopy, under one hour, with interpretation 4.  Left diagnostic ureteroscopy 5. Left 6x26 JJ ureteral stent placement  Attending: Cleda MccreedyPatrick Mackenzie  Anesthesia: General  Estimated blood loss: None  Drains: left 6x26 JJ ureteral stent with tether  Specimens: none  Antibiotics: ancef  Findings: mild left hydronephrosis. No calculus visualized. Edema of left UVJ consistent with recently passed caclulus.  Indications: Patient is a 36 year old female/female with a history of hydronephrosis and left UVJ calculus.  After discussing treatment options, she decided proceed with left ureteroscopic stone extraction.  Procedure her in detail: The patient was brought to the operating room and a brief timeout was done to ensure correct patient, correct procedure, correct site.  General anesthesia was administered patient was placed in dorsal lithotomy position.  Her genitalia was then prepped and draped in usual sterile fashion.  A rigid 22 French cystoscope was passed in the urethra and the bladder.  Bladder was inspected free masses or lesions.  the right ureteral orifices were in the normal orthotopic locations.  a 6 french ureteral catheter was then instilled into the right ureter orifice.  a gentle retrograde was obtained and findings noted above.  we then placed a zip wire through the ureteral catheter and advanced up to the renal pelvis.  we then removed the cystoscope and cannulated the right ureteral orifice with a semirigid ureteroscope.  we then performed ureteroscopy up to the level of the UPJ. No stone or tumor was encountered.  Due to significant edema at the UVJ we elected to place a stent. A 6x26 JJ ureteral stent was advanced over the original zipwire. The wire was removed and good coil was noted in the renal pelvis under fluoroscopy and  the bladder under direct vision.  the bladder was then drained and this concluded the procedure which was well tolerated by patient.  Complications: None  Condition: Stable, extubated, transferred to PACU  Plan: Pt is to followup in 2 weeks. She is to remove her stent by pulling the tether in 3 days

## 2017-02-01 NOTE — Anesthesia Procedure Notes (Signed)
Procedure Name: Intubation Date/Time: 02/01/2017 8:16 AM Performed by: Renella CunasHAZEL, Maile Linford D Pre-anesthesia Checklist: Patient identified, Emergency Drugs available, Suction available and Patient being monitored Patient Re-evaluated:Patient Re-evaluated prior to inductionOxygen Delivery Method: Circle system utilized Preoxygenation: Pre-oxygenation with 100% oxygen Intubation Type: IV induction Ventilation: Mask ventilation without difficulty Laryngoscope Size: Glidescope and 3 Grade View: Grade I Tube type: Oral Tube size: 7.0 mm Number of attempts: 1 Airway Equipment and Method: Stylet and Oral airway Placement Confirmation: ETT inserted through vocal cords under direct vision,  positive ETCO2 and breath sounds checked- equal and bilateral Secured at: 22 cm Tube secured with: Tape Dental Injury: Teeth and Oropharynx as per pre-operative assessment

## 2017-02-01 NOTE — Anesthesia Preprocedure Evaluation (Addendum)
Anesthesia Evaluation  Patient identified by MRN, date of birth, ID band Patient awake    Reviewed: Allergy & Precautions, NPO status , Patient's Chart, lab work & pertinent test results, reviewed documented beta blocker date and time   Airway Mallampati: II  TM Distance: >3 FB Neck ROM: Full    Dental  (+) Teeth Intact, Dental Advisory Given   Pulmonary asthma ,    Pulmonary exam normal breath sounds clear to auscultation       Cardiovascular hypertension, Pt. on home beta blockers  Rhythm:Regular Rate:Tachycardia     Neuro/Psych  Headaches, PSYCHIATRIC DISORDERS Depression Bipolar Disorder    GI/Hepatic Neg liver ROS, hiatal hernia, GERD  Medicated and Poorly Controlled,Chronic gastritis   Endo/Other  Hypothyroidism Morbid obesity  Renal/GU Renal InsufficiencyRenal disease (nephrolithiasis)     Musculoskeletal  (+) Arthritis , Fibromyalgia -, narcotic dependent  Abdominal   Peds  Hematology  (+) Blood dyscrasia, anemia ,   Anesthesia Other Findings Day of surgery medications reviewed with the patient.  Reproductive/Obstetrics                            Anesthesia Physical Anesthesia Plan  ASA: III  Anesthesia Plan: General   Post-op Pain Management:    Induction: Intravenous  Airway Management Planned: Oral ETT  Additional Equipment:   Intra-op Plan:   Post-operative Plan: Extubation in OR  Informed Consent: I have reviewed the patients History and Physical, chart, labs and discussed the procedure including the risks, benefits and alternatives for the proposed anesthesia with the patient or authorized representative who has indicated his/her understanding and acceptance.   Dental advisory given  Plan Discussed with: CRNA  Anesthesia Plan Comments: (Risks/benefits of general anesthesia discussed with patient including risk of damage to teeth, lips, gum, and tongue,  nausea/vomiting, allergic reactions to medications, and the possibility of heart attack, stroke and death.  All patient questions answered.  Patient wishes to proceed.)      Anesthesia Quick Evaluation

## 2017-02-01 NOTE — H&P (Signed)
Urology Admission H&P  Chief Complaint: left flank pain  History of Present Illness: Ms Darlene Frazier is a 36yo with a left ureteral calculus who has failed medical therapy  Past Medical History:  Diagnosis Date  . Arthritis   . Bipolar 1 disorder (HCC)   . Borderline personality disorder   . Depression   . Dyslipidemia   . Environmental and seasonal allergies   . Exercise-induced asthma    no inhaler  . Fibromyalgia   . Frequency of urination   . GERD (gastroesophageal reflux disease)   . Hiatal hernia   . History of chronic gastritis   . History of kidney stones   . Hyperprolactinemia (HCC)    followed by dr doerr [facility] endocrinology in [place])--  breast leak milk  . Hypertension   . Hypothyroidism   . IBS (irritable bowel syndrome)   . Insomnia   . Left flank pain   . Left ureteral stone   . Migraines   . Nephrolithiasis    bilateral non-obstructive per CT 01-24-2017  . Snores   . Tachycardia    consult cardiologist-- dr Laveda Abbe [facility] cardiology in [place])  01-26-2017 per pt schedule to wear holter moniter and get sleep study  . Urgency of urination   . Wears glasses    Past Surgical History:  Procedure Laterality Date  . BREAST BIOPSY Right 2009   benign  . CYSTO/  URETERSCOPIC STONE EXTRACTION /  STENT PLACEMENT  2007  . ESOPHAGOGASTRODUODENOSCOPY N/A 11/16/2015   Procedure: ESOPHAGOGASTRODUODENOSCOPY (EGD);  Surgeon: Dorena Cookey, MD;  Location: [facility] ENDOSCOPY;  Service: Endoscopy;  Laterality: N/A;  . FOOT SURGERY Left 1998   nodule removed  . LAPAROSCOPIC CHOLECYSTECTOMY  02/06/2013  . LUMBAR LAMINECTOMY/DECOMPRESSION MICRODISCECTOMY Left 06/03/2013   Procedure: LEFT LUMBAR FIVE SACRAL ONE LAMINECTOMY/DECOMPRESSION MICRODISCECTOMY ;  Surgeon: Lisbeth Renshaw, MD;  Location: [facility] NEURO ORS;  Service: Neurosurgery;  Laterality: Left;  LEFT L5S1 microdiskectomy  . POSTERIOR LUMBAR FUSION  10/06/2016   L5 -- S1    Home Medications:  Prescriptions Prior to  Admission  Medication Sig Dispense Refill Last Dose  . bismuth subsalicylate (PEPTO BISMOL) 262 MG chewable tablet Chew 524 mg by mouth daily as needed for indigestion.   Past Week at Unknown time  . citalopram (CELEXA) 20 MG tablet Take 20 mg by mouth every evening.   01/31/2017 at Unknown time  . Cranberry (SM CRANBERRY) 300 MG tablet Take 600 mg by mouth 2 (two) times daily.    01/31/2017 at Unknown time  . cyclobenzaprine (FLEXERIL) 10 MG tablet Take 10-20 mg by mouth 2 (two) times daily as needed for muscle spasms.   01/31/2017 at Unknown time  . dexlansoprazole (DEXILANT) 60 MG capsule Take 60 mg by mouth every morning.    02/01/2017 at 0500  . diazepam (VALIUM) 5 MG tablet Take 5 mg by mouth every 8 (eight) hours as needed for anxiety.    01/31/2017 at Unknown time  . doxylamine, Sleep, (UNISOM) 25 MG tablet Take 75 mg by mouth at bedtime.   01/31/2017 at Unknown time  . DULoxetine (CYMBALTA) 60 MG capsule Take 120 mg by mouth at bedtime.    01/31/2017 at Unknown time  . gabapentin (NEURONTIN) 300 MG capsule Take 1,800 mg by mouth at bedtime.    01/31/2017 at Unknown time  . gemfibrozil (LOPID) 600 MG tablet Take 600 mg by mouth every evening.    01/31/2017 at Unknown time  . hydrOXYzine (ATARAX/VISTARIL) 25 MG tablet Take 25  mg by mouth 2 (two) times daily as needed for itching.   01/31/2017 at Unknown time  . levothyroxine (SYNTHROID, LEVOTHROID) 50 MCG tablet Take 50 mcg by mouth every evening.    01/31/2017 at Unknown time  . loratadine (CLARITIN) 10 MG tablet Take 10 mg by mouth every evening.   01/31/2017 at Unknown time  . Lurasidone HCl (LATUDA) 60 MG TABS Take 60 mg by mouth daily with supper.   01/31/2017 at Unknown time  . metoprolol succinate (TOPROL-XL) 25 MG 24 hr tablet Take 25 mg by mouth every evening.    01/31/2017 at 2130  . ondansetron (ZOFRAN ODT) 4 MG disintegrating tablet Take 1 tablet (4 mg total) by mouth every 8 (eight) hours as needed for nausea or vomiting. 20 tablet 0 01/30/2017  .  oxyCODONE-acetaminophen (PERCOCET/ROXICET) 5-325 MG tablet Take 1 tablet by mouth every 6 (six) hours as needed for severe pain. 11 tablet 0 02/01/2017 at 0500  . polycarbophil (RA FIBER-TAB) 625 MG tablet Take 1,875 mg by mouth at bedtime.    01/31/2017 at Unknown time  . Polyvinyl Alcohol (LUBRICANT DROPS OP) Apply 1 drop to eye daily as needed (dry eyes).   01/28/2017  . ranitidine (ZANTAC) 150 MG tablet Take 300 mg by mouth at bedtime.   01/31/2017 at Unknown time  . Simethicone (GAS-X PO) Take 1-2 tablets by mouth daily as needed (gas).   Past Month at Unknown time  . tamsulosin (FLOMAX) 0.4 MG CAPS capsule Take 1 capsule (0.4 mg total) by mouth daily. (Patient taking differently: Take 0.4 mg by mouth daily after supper. ) 30 capsule 0 01/31/2017 at Unknown time  . tolterodine (DETROL LA) 2 MG 24 hr capsule Take 2 mg by mouth every morning.    02/01/2017 at 0500  . topiramate (TOPAMAX) 50 MG tablet Take 100 mg by mouth every evening.    01/31/2017 at Unknown time  . EPINEPHrine 0.3 mg/0.3 mL IJ SOAJ injection Inject 0.3 mg into the muscle as needed (allergic reaction).    Unknown at Unknown time   Allergies:  Allergies  Allergen Reactions  . Other Anaphylaxis and Hives    Grass and hay  . Morphine And Related Other (See Comments)    Severe migraines in high doses    Family History  Problem Relation Age of Onset  . Hypertension Mother   . Bipolar disorder Mother   . Hypertension Father   . Depression Brother   . Alcohol abuse Brother   . Bipolar disorder Brother    Social History:  reports that she has never smoked. She has never used smokeless tobacco. She reports that she drinks alcohol. She reports that she does not use drugs.  Review of Systems  Genitourinary: Positive for flank pain.  All other systems reviewed and are negative.   Physical Exam:  Vital signs in last 24 hours: Temp:  [98 F (36.7 C)] 98 F (36.7 C) (05/03 0601) Pulse Rate:  [123] 123 (05/03 0601) Resp:  [18] 18  (05/03 0601) BP: (138)/(83) 138/83 (05/03 0601) SpO2:  [98 %] 98 % (05/03 0601) Weight:  [132.5 kg (292 lb)] 132.5 kg (292 lb) (05/03 0601) Physical Exam  Constitutional: She is oriented to person, place, and time. She appears well-developed and well-nourished.  HENT:  Head: Normocephalic and atraumatic.  Eyes: EOM are normal. Pupils are equal, round, and reactive to light.  Neck: Normal range of motion. No thyromegaly present.  Cardiovascular: Normal rate and regular rhythm.   Respiratory: Effort  normal. No respiratory distress.  GI: Soft. She exhibits no distension.  Musculoskeletal: Normal range of motion. She exhibits no edema.  Neurological: She is alert and oriented to person, place, and time.  Skin: Skin is warm and dry.  Psychiatric: She has a normal mood and affect. Her behavior is normal. Judgment and thought content normal.    Laboratory Data:  No results found for this or any previous visit (from the past 24 hour(s)). No results found for this or any previous visit (from the past 240 hour(s)). Creatinine: No results for input(s): CREATININE in the last 168 hours. Baseline Creatinine: unknwon  Impression/Assessment:  36yo with left ureteral calculus  Plan:  The risks/benefits/alternaitves to L ureteroscopic stone extraction was explained to the patient and she understands and wishes to proceed with surgery  Wilkie AyePatrick Wake Conlee 02/01/2017, 8:09 AM

## 2017-02-01 NOTE — Anesthesia Procedure Notes (Signed)
Procedure Name: Intubation Date/Time: 02/01/2017 8:16 AM Performed by: Renella CunasHAZEL, Joss Friedel D Pre-anesthesia Checklist: Patient identified, Emergency Drugs available, Suction available and Patient being monitored Patient Re-evaluated:Patient Re-evaluated prior to inductionOxygen Delivery Method: Circle system utilized Preoxygenation: Pre-oxygenation with 100% oxygen Intubation Type: IV induction Ventilation: Mask ventilation without difficulty Tube type: Oral Number of attempts: 1 Airway Equipment and Method: Stylet and Oral airway Placement Confirmation: ETT inserted through vocal cords under direct vision,  positive ETCO2 and breath sounds checked- equal and bilateral Tube secured with: Tape Dental Injury: Teeth and Oropharynx as per pre-operative assessment

## 2017-02-02 ENCOUNTER — Encounter (HOSPITAL_BASED_OUTPATIENT_CLINIC_OR_DEPARTMENT_OTHER): Payer: Self-pay | Admitting: Urology

## 2017-02-06 ENCOUNTER — Ambulatory Visit: Payer: Medicare Other | Attending: Neurosurgery | Admitting: Physical Therapy

## 2017-02-06 DIAGNOSIS — M62830 Muscle spasm of back: Secondary | ICD-10-CM | POA: Insufficient documentation

## 2017-02-06 DIAGNOSIS — R262 Difficulty in walking, not elsewhere classified: Secondary | ICD-10-CM | POA: Insufficient documentation

## 2017-02-06 DIAGNOSIS — M5442 Lumbago with sciatica, left side: Secondary | ICD-10-CM | POA: Insufficient documentation

## 2017-02-08 ENCOUNTER — Ambulatory Visit: Payer: Medicare Other | Admitting: Physical Therapy

## 2017-02-13 ENCOUNTER — Ambulatory Visit: Payer: Medicare Other | Admitting: Rehabilitation

## 2017-02-13 DIAGNOSIS — R262 Difficulty in walking, not elsewhere classified: Secondary | ICD-10-CM | POA: Diagnosis present

## 2017-02-13 DIAGNOSIS — M5442 Lumbago with sciatica, left side: Secondary | ICD-10-CM

## 2017-02-13 DIAGNOSIS — M62830 Muscle spasm of back: Secondary | ICD-10-CM

## 2017-02-13 NOTE — Therapy (Signed)
[facility] [address] [place], [place], [address] Phone: [phone]   Fax:  [phone]  Physical Therapy Treatment  Patient Details  Name: Darlene Frazier MRN: [id number] Date of Birth: 02-25-1981 Referring Provider: Conchita Paris  Encounter Date: 02/13/2017      PT End of Session - 02/13/17 1532    Visit Number 5   Date for PT Re-Evaluation 03/27/17   PT Start Time 1448   PT Stop Time 1532   PT Time Calculation (min) 44 min   Activity Tolerance Patient tolerated treatment well      Past Medical History:  Diagnosis Date  . Arthritis   . Bipolar 1 disorder (HCC)   . Borderline personality disorder   . Depression   . Dyslipidemia   . Environmental and seasonal allergies   . Exercise-induced asthma    no inhaler  . Fibromyalgia   . Frequency of urination   . GERD (gastroesophageal reflux disease)   . Hiatal hernia   . History of chronic gastritis   . History of kidney stones   . Hyperprolactinemia (HCC)    followed by dr doerr [facility] endocrinology in [place])--  breast leak milk  . Hypertension   . Hypothyroidism   . IBS (irritable bowel syndrome)   . Insomnia   . Left flank pain   . Left ureteral stone   . Migraines   . Nephrolithiasis    bilateral non-obstructive per CT 01-24-2017  . Snores   . Tachycardia    consult cardiologist-- dr Laveda Abbe [facility] cardiology in [place])  01-26-2017 per pt schedule to wear holter moniter and get sleep study  . Urgency of urination   . Wears glasses     Past Surgical History:  Procedure Laterality Date  . BREAST BIOPSY Right 2009   benign  . CYSTO/  URETERSCOPIC STONE EXTRACTION /  STENT PLACEMENT  2007  . CYSTOSCOPY WITH RETROGRADE PYELOGRAM, URETEROSCOPY AND STENT PLACEMENT Left 02/01/2017   Procedure: CYSTOSCOPY WITH RETROGRADE PYELOGRAM, URETEROSCOPY AND STENT PLACEMENT;  Surgeon: Malen Gauze, MD;  Location: [facility];   Service: Urology;  Laterality: Left;  . ESOPHAGOGASTRODUODENOSCOPY N/A 11/16/2015   Procedure: ESOPHAGOGASTRODUODENOSCOPY (EGD);  Surgeon: Dorena Cookey, MD;  Location: [facility] ENDOSCOPY;  Service: Endoscopy;  Laterality: N/A;  . FOOT SURGERY Left 1998   nodule removed  . LAPAROSCOPIC CHOLECYSTECTOMY  02/06/2013  . LUMBAR LAMINECTOMY/DECOMPRESSION MICRODISCECTOMY Left 06/03/2013   Procedure: LEFT LUMBAR FIVE SACRAL ONE LAMINECTOMY/DECOMPRESSION MICRODISCECTOMY ;  Surgeon: Lisbeth Renshaw, MD;  Location: [facility];  Service: Neurosurgery;  Laterality: Left;  LEFT L5S1 microdiskectomy  . POSTERIOR LUMBAR FUSION  10/06/2016   L5 -- S1    There were no vitals filed for this visit.      Subjective Assessment - 02/13/17 1448    Subjective Had a kidney stent placement 2 weeks ago having some residual pain from the surgery.  The back feels exactly the same.  Having some difficulty sleeping due to new bladder urgency.  The L leg pain and numbness has increased since the kidney surgery.  The back surgeon is aware of this.  Also watching 2 more bulging discs.  Supposed to be starting with pain management.  Exercises with too much bladder pressure may be diffcult now   Currently in Pain? Yes   Pain Score 3    Pain Location Back   Pain Orientation Mid   Pain Radiating Towards continues with pain into the  L back, leg, and 3-5 toes at night mainly                         [facility]/Ex[facility] - 02/13/17 0001      Lumbar Exercises: Aerobic   Elliptical Nustep level 3x595min     Lumbar Exercises: Machines for Strengthening   Leg Press 20# with ball squeeze for pelvic floor 2x10   Other Lumbar Machine Exercise row & lat pull 20 # 2 sets 10     Lumbar Exercises: Supine   Ab Set 15 reps   AB Set Limitations with hooklying ball squeeze 6"x10   Bridge 20 reps     Lumbar Exercises: Quadruped   Madcat/Old Horse 10 reps   Single Arm Raise 5 reps   Straight Leg Raise 5 reps    Opposite Arm/Leg Raise Limitations wrist pain on attempt today                  PT Short Term Goals - 02/13/17 1540      PT SHORT TERM GOAL #1   Title independent with initial HEP   Status New           PT Long Term Goals - 02/13/17 1540      PT LONG TERM GOAL #1   Title walk 500 feet with a SPC with 1 rest   Status On-going     PT LONG TERM GOAL #2   Title increase lumbar ROM 25%   Status On-going     PT LONG TERM GOAL #3   Title decrease pain 50%   Status On-going     PT LONG TERM GOAL #4   Title report able to stand 5 minutes   Status On-going               Plan - 02/13/17 1532    Clinical Impression Statement pt declines modalities today.  Pt without sig change in status post kidney surgery but now with more bladder urgency.  kidney MD and spine MD are working on this.  Also will be starting pain managment.  Pt apprehensive about bladder activation with exercise but tolerated all without incident.  Did have some increased wrist pain with quadruped position   Rehab Potential Fair   PT Frequency 2x / week   PT Duration 6 weeks   PT Treatment/Interventions ADLs/Self Care Home Management;Electrical Stimulation;Cryotherapy;Moist Heat;Therapeutic activities;Therapeutic exercise;Neuromuscular re-education;Patient/family education;Manual techniques   PT Next Visit Plan continue gait, core and LE strength   Consulted and Agree with Plan of Care Patient      Patient will benefit from skilled therapeutic intervention in order to improve the following deficits and impairments:  Cardiopulmonary status limiting activity, Decreased activity tolerance, Decreased mobility, Decreased range of motion, Decreased strength, Difficulty walking, Increased muscle spasms, Impaired flexibility, Postural dysfunction, Improper body mechanics, Pain  Visit Diagnosis: Acute bilateral low back pain with left-sided sciatica  Muscle spasm of back  Difficulty in walking, not  elsewhere classified     Problem List Patient Active Problem List   Diagnosis Date Noted  . S/P lumbar fusion 10/06/2016  . Cholelithiasis with acute or chronic cholecystitis 02/04/2013    Idamae Lusherevis, Tegh Franek R, DPT, CMP 02/13/2017, 3:41 PM  [facility] [address] [place], [place], [address] Phone: [phone]   Fax:  [phone]  Name: Darlene Frazier MRN: [id number] Date of Birth: 05/15/1981

## 2017-02-15 ENCOUNTER — Ambulatory Visit: Payer: Medicare Other | Admitting: Rehabilitation

## 2017-02-15 ENCOUNTER — Encounter: Payer: Self-pay | Admitting: Rehabilitation

## 2017-02-15 DIAGNOSIS — M62830 Muscle spasm of back: Secondary | ICD-10-CM

## 2017-02-15 DIAGNOSIS — M5442 Lumbago with sciatica, left side: Secondary | ICD-10-CM | POA: Diagnosis not present

## 2017-02-15 DIAGNOSIS — R262 Difficulty in walking, not elsewhere classified: Secondary | ICD-10-CM

## 2017-02-15 NOTE — Therapy (Signed)
[facility] [address] [place], [place], [address] Phone: [phone]   Fax:  [phone]  Physical Therapy Treatment  Patient Details  Name: Darlene Frazier MRN: [id number] Date of Birth: [birth year] Referring Provider: Conchita Paris  Encounter Date: 02/15/2017      PT End of Session - 02/15/17 1522    Visit Number 6   Date for PT Re-Evaluation 03/27/17   PT Start Time 1445   PT Stop Time 1538   PT Time Calculation (min) 53 min   Activity Tolerance Patient tolerated treatment well      Past Medical History:  Diagnosis Date  . Arthritis   . Bipolar 1 disorder (HCC)   . Borderline personality disorder   . Depression   . Dyslipidemia   . Environmental and seasonal allergies   . Exercise-induced asthma    no inhaler  . Fibromyalgia   . Frequency of urination   . GERD (gastroesophageal reflux disease)   . Hiatal hernia   . History of chronic gastritis   . History of kidney stones   . Hyperprolactinemia (HCC)    followed by dr doerr [facility] endocrinology in [place])--  breast leak milk  . Hypertension   . Hypothyroidism   . IBS (irritable bowel syndrome)   . Insomnia   . Left flank pain   . Left ureteral stone   . Migraines   . Nephrolithiasis    bilateral non-obstructive per CT 01-24-2017  . Snores   . Tachycardia    consult cardiologist-- dr Laveda Abbe [facility] cardiology in [place])  01-26-2017 per pt schedule to wear holter moniter and get sleep study  . Urgency of urination   . Wears glasses     Past Surgical History:  Procedure Laterality Date  . BREAST BIOPSY Right 2009   benign  . CYSTO/  URETERSCOPIC STONE EXTRACTION /  STENT PLACEMENT  2007  . CYSTOSCOPY WITH RETROGRADE PYELOGRAM, URETEROSCOPY AND STENT PLACEMENT Left 02/01/2017   Procedure: CYSTOSCOPY WITH RETROGRADE PYELOGRAM, URETEROSCOPY AND STENT PLACEMENT;  Surgeon: Malen Gauze, MD;  Location: [facility];   Service: Urology;  Laterality: Left;  . ESOPHAGOGASTRODUODENOSCOPY N/A 11/16/2015   Procedure: ESOPHAGOGASTRODUODENOSCOPY (EGD);  Surgeon: Dorena Cookey, MD;  Location: [facility] ENDOSCOPY;  Service: Endoscopy;  Laterality: N/A;  . FOOT SURGERY Left 1998   nodule removed  . LAPAROSCOPIC CHOLECYSTECTOMY  02/06/2013  . LUMBAR LAMINECTOMY/DECOMPRESSION MICRODISCECTOMY Left 06/03/2013   Procedure: LEFT LUMBAR FIVE SACRAL ONE LAMINECTOMY/DECOMPRESSION MICRODISCECTOMY ;  Surgeon: Lisbeth Renshaw, MD;  Location: [facility];  Service: Neurosurgery;  Laterality: Left;  LEFT L5S1 microdiskectomy  . POSTERIOR LUMBAR FUSION  10/06/2016   L5 -- S1    There were no vitals filed for this visit.      Subjective Assessment - 02/15/17 1438    Subjective Had increased leg soreness today. Exercise related.  From the leg press   Currently in Pain? Yes   Pain Score 4    Pain Location Back  bil thighs   Pain Orientation Left                         [facility] Adult PT Treatment/Exercise - 02/15/17 0001      Lumbar Exercises: Aerobic   Stationary Bike x     Lumbar Exercises: Machines for Strengthening   Leg Press 20# with ball squeeze for pelvic floor 2x10   Other Lumbar Machine Exercise row &  lat pull 20 # 2 sets 10     Lumbar Exercises: Standing   Other Standing Lumbar Exercises SL cone reaches with TrA x 8bil     Lumbar Exercises: Seated   Other Seated Lumbar Exercises alt UE/LE 2x10     Lumbar Exercises: Supine   Bridge 15 reps   Bridge Limitations with orange ball   Other Supine Lumbar Exercises SL dying bug x 10 bil     Lumbar Exercises: Prone   Other Prone Lumbar Exercises modified mini plank 3x10"     Moist Heat Therapy   Number Minutes Moist Heat 15 Minutes   Moist Heat Location Lumbar Spine     Electrical Stimulation   Electrical Stimulation Location low back   Electrical Stimulation Action IFC   Electrical Stimulation Parameters supine   Electrical Stimulation Goals  Pain                  PT Short Term Goals - 02/15/17 1523      PT SHORT TERM GOAL #1   Title independent with initial HEP   Status Achieved           PT Long Term Goals - 02/13/17 1540      PT LONG TERM GOAL #1   Title walk 500 feet with a SPC with 1 rest   Status On-going     PT LONG TERM GOAL #2   Title increase lumbar ROM 25%   Status On-going     PT LONG TERM GOAL #3   Title decrease pain 50%   Status On-going     PT LONG TERM GOAL #4   Title report able to stand 5 minutes   Status On-going               Plan - 02/15/17 1522    Clinical Impression Statement Pt reports LE soreness after last treatment but was aggreeable to continue same course of exercise.  Added mini forearm knee plank, single leg dying bug, and standing SL work today with good tolerance.  Pt reports "I can feel it" at the end of treatment but "in a very good way".     PT Frequency 2x / week   PT Duration 6 weeks   PT Treatment/Interventions ADLs/Self Care Home Management;Electrical Stimulation;Cryotherapy;Moist Heat;Therapeutic activities;Therapeutic exercise;Neuromuscular re-education;Patient/family education;Manual techniques   PT Next Visit Plan continue gait, core and LE strength   Consulted and Agree with Plan of Care Patient      Patient will benefit from skilled therapeutic intervention in order to improve the following deficits and impairments:  Cardiopulmonary status limiting activity, Decreased activity tolerance, Decreased mobility, Decreased range of motion, Decreased strength, Difficulty walking, Increased muscle spasms, Impaired flexibility, Postural dysfunction, Improper body mechanics, Pain  Visit Diagnosis: Acute bilateral low back pain with left-sided sciatica  Muscle spasm of back  Difficulty in walking, not elsewhere classified     Problem List Patient Active Problem List   Diagnosis Date Noted  . S/P lumbar fusion 10/06/2016  . Cholelithiasis with  acute or chronic cholecystitis 02/04/2013    Idamae Lusherevis, Kara R, DPT, CMP 02/15/2017, 3:26 PM  [facility] [address] [place], [place], [address] Phone: [phone]   Fax:  [phone]  Name: Reita Chardiccolina L Penn MRN: [id number] Date of Birth: 05/27/1981

## 2017-02-19 ENCOUNTER — Ambulatory Visit: Payer: Medicare Other | Admitting: Physical Therapy

## 2017-02-20 ENCOUNTER — Ambulatory Visit: Payer: Medicare Other | Admitting: Rehabilitation

## 2017-02-20 DIAGNOSIS — M62830 Muscle spasm of back: Secondary | ICD-10-CM

## 2017-02-20 DIAGNOSIS — R262 Difficulty in walking, not elsewhere classified: Secondary | ICD-10-CM

## 2017-02-20 DIAGNOSIS — M5442 Lumbago with sciatica, left side: Secondary | ICD-10-CM | POA: Diagnosis not present

## 2017-02-20 NOTE — Therapy (Signed)
[facility] [address] [place], [place], [address] Phone: [phone]   Fax:  [phone]  Physical Therapy Treatment  Patient Details  Name: Darlene Frazier MRN: [id number] Date of Birth: 1981/07/02 Referring Provider: Conchita Paris  Encounter Date: 02/20/2017      PT End of Session - 02/20/17 1529    Visit Number 7   Date for PT Re-Evaluation 03/27/17   PT Start Time 1447   PT Stop Time 1537   PT Time Calculation (min) 50 min      Past Medical History:  Diagnosis Date  . Arthritis   . Bipolar 1 disorder (HCC)   . Borderline personality disorder   . Depression   . Dyslipidemia   . Environmental and seasonal allergies   . Exercise-induced asthma    no inhaler  . Fibromyalgia   . Frequency of urination   . GERD (gastroesophageal reflux disease)   . Hiatal hernia   . History of chronic gastritis   . History of kidney stones   . Hyperprolactinemia (HCC)    followed by dr doerr [facility] endocrinology in [place])--  breast leak milk  . Hypertension   . Hypothyroidism   . IBS (irritable bowel syndrome)   . Insomnia   . Left flank pain   . Left ureteral stone   . Migraines   . Nephrolithiasis    bilateral non-obstructive per CT 01-24-2017  . Snores   . Tachycardia    consult cardiologist-- dr Laveda Abbe [facility] cardiology in [place])  01-26-2017 per pt schedule to wear holter moniter and get sleep study  . Urgency of urination   . Wears glasses     Past Surgical History:  Procedure Laterality Date  . BREAST BIOPSY Right 2009   benign  . CYSTO/  URETERSCOPIC STONE EXTRACTION /  STENT PLACEMENT  2007  . CYSTOSCOPY WITH RETROGRADE PYELOGRAM, URETEROSCOPY AND STENT PLACEMENT Left 02/01/2017   Procedure: CYSTOSCOPY WITH RETROGRADE PYELOGRAM, URETEROSCOPY AND STENT PLACEMENT;  Surgeon: Malen Gauze, MD;  Location: [facility];  Service: Urology;  Laterality: Left;  .  ESOPHAGOGASTRODUODENOSCOPY N/A 11/16/2015   Procedure: ESOPHAGOGASTRODUODENOSCOPY (EGD);  Surgeon: Dorena Cookey, MD;  Location: [facility] ENDOSCOPY;  Service: Endoscopy;  Laterality: N/A;  . FOOT SURGERY Left 1998   nodule removed  . LAPAROSCOPIC CHOLECYSTECTOMY  02/06/2013  . LUMBAR LAMINECTOMY/DECOMPRESSION MICRODISCECTOMY Left 06/03/2013   Procedure: LEFT LUMBAR FIVE SACRAL ONE LAMINECTOMY/DECOMPRESSION MICRODISCECTOMY ;  Surgeon: Lisbeth Renshaw, MD;  [facility]: [facility];  Service: Neurosurgery;  Laterality: Left;  LEFT L5S1 microdiskectomy  . POSTERIOR LUMBAR FUSION  10/06/2016   L5 -- S1    There were no vitals filed for this visit.      Subjective Assessment - 02/20/17 1448    Subjective not too bad today.  The usual. Getting a heart monitor for 48 hours starting tomorrow due to high pulse rate; also sleep study   Currently in Pain? Yes   Pain Score 4    Pain Location Back   Pain Orientation Left   Pain Descriptors / Indicators Cramping                         [facility] Adult PT Treatment/Exercise - 02/20/17 0001      Lumbar Exercises: Aerobic   Elliptical Nustep level 5x81min     Lumbar Exercises: Machines for Strengthening   Leg Press 20# with ball squeeze for pelvic floor 2x10  Other Lumbar Machine Exercise row & lat pull 20 #x 10 25#x10     Lumbar Exercises: Standing   Other Standing Lumbar Exercises bosu step up with hand hold x 10bil   Other Standing Lumbar Exercises bosu stand x 4min, mini squats x 10     Lumbar Exercises: Seated   Other Seated Lumbar Exercises alt UE/LE 2x10 2#     Lumbar Exercises: Supine   Bridge 15 reps   Other Supine Lumbar Exercises full dying bug 2x10     Lumbar Exercises: Prone   Other Prone Lumbar Exercises modified mini plank 3x10"     Moist Heat Therapy   Number Minutes Moist Heat 15 Minutes   Moist Heat Location Lumbar Spine     Electrical Stimulation   Electrical Stimulation Location low back   Electrical  Stimulation Action IFC   Electrical Stimulation Parameters supine   Electrical Stimulation Goals Pain                  PT Short Term Goals - 02/15/17 1523      PT SHORT TERM GOAL #1   Title independent with initial HEP   Status Achieved           PT Long Term Goals - 02/13/17 1540      PT LONG TERM GOAL #1   Title walk 500 feet with a SPC with 1 rest   Status On-going     PT LONG TERM GOAL #2   Title increase lumbar ROM 25%   Status On-going     PT LONG TERM GOAL #3   Title decrease pain 50%   Status On-going     PT LONG TERM GOAL #4   Title report able to stand 5 minutes   Status On-going               Plan - 02/20/17 1530    Clinical Impression Statement Continues to be sore after sessions but not long lasting.  Tolerating all well.  Added bosu core/balance work today with difficulty not losing balance.     PT Frequency 2x / week   PT Duration 6 weeks   PT Next Visit Plan continue gait, core and LE strength      Patient will benefit from skilled therapeutic intervention in order to improve the following deficits and impairments:  Cardiopulmonary status limiting activity, Decreased activity tolerance, Decreased mobility, Decreased range of motion, Decreased strength, Difficulty walking, Increased muscle spasms, Impaired flexibility, Postural dysfunction, Improper body mechanics, Pain  Visit Diagnosis: Acute bilateral low back pain with left-sided sciatica  Muscle spasm of back  Difficulty in walking, not elsewhere classified     Problem List Patient Active Problem List   Diagnosis Date Noted  . S/P lumbar fusion 10/06/2016  . Cholelithiasis with acute or chronic cholecystitis 02/04/2013    Idamae Lusherevis, Kara R, DPT, CMP 02/20/2017, 3:32 PM  [facility] [address] [place], [place], [address] Phone: [phone]   Fax:  [phone]  Name: Darlene Frazier MRN:  [id number] Date of Birth: 10/10/1980

## 2017-02-21 ENCOUNTER — Emergency Department (HOSPITAL_BASED_OUTPATIENT_CLINIC_OR_DEPARTMENT_OTHER): Payer: Medicare Other

## 2017-02-21 ENCOUNTER — Emergency Department (HOSPITAL_BASED_OUTPATIENT_CLINIC_OR_DEPARTMENT_OTHER)
Admission: EM | Admit: 2017-02-21 | Discharge: 2017-02-21 | Disposition: A | Discharge status: Home / Self Care | Payer: Medicare Other | Attending: Physician Assistant | Admitting: Physician Assistant

## 2017-02-21 ENCOUNTER — Encounter (HOSPITAL_BASED_OUTPATIENT_CLINIC_OR_DEPARTMENT_OTHER): Payer: Self-pay | Admitting: *Deleted

## 2017-02-21 ENCOUNTER — Ambulatory Visit: Payer: Medicare Other | Admitting: Physical Therapy

## 2017-02-21 DIAGNOSIS — R109 Unspecified abdominal pain: Secondary | ICD-10-CM

## 2017-02-21 DIAGNOSIS — Z79899 Other long term (current) drug therapy: Secondary | ICD-10-CM | POA: Diagnosis not present

## 2017-02-21 DIAGNOSIS — E039 Hypothyroidism, unspecified: Secondary | ICD-10-CM | POA: Insufficient documentation

## 2017-02-21 DIAGNOSIS — I10 Essential (primary) hypertension: Secondary | ICD-10-CM | POA: Diagnosis not present

## 2017-02-21 LAB — URINALYSIS, ROUTINE W REFLEX MICROSCOPIC
BILIRUBIN URINE: NEGATIVE
Glucose, UA: NEGATIVE mg/dL
Ketones, ur: NEGATIVE mg/dL
Leukocytes, UA: NEGATIVE
NITRITE: NEGATIVE
PROTEIN: NEGATIVE mg/dL
SPECIFIC GRAVITY, URINE: 1.012 (ref 1.005–1.030)
pH: 6 (ref 5.0–8.0)

## 2017-02-21 LAB — COMPREHENSIVE METABOLIC PANEL
ALBUMIN: 3.9 g/dL (ref 3.5–5.0)
ALK PHOS: 80 U/L (ref 38–126)
ALT: 42 U/L (ref 14–54)
AST: 29 U/L (ref 15–41)
Anion gap: 7 (ref 5–15)
BUN: 14 mg/dL (ref 6–20)
CALCIUM: 9.3 mg/dL (ref 8.9–10.3)
CHLORIDE: 108 mmol/L (ref 101–111)
CO2: 23 mmol/L (ref 22–32)
CREATININE: 1.1 mg/dL — AB (ref 0.44–1.00)
GFR calc Af Amer: >60 mL/min (ref >60)
GFR calc non Af Amer: >60 mL/min (ref >60)
GLUCOSE: 126 mg/dL — AB (ref 65–99)
Potassium: 3.6 mmol/L (ref 3.5–5.1)
SODIUM: 138 mmol/L (ref 135–145)
Total Bilirubin: 0.1 mg/dL — ABNORMAL LOW (ref 0.3–1.2)
Total Protein: 7.3 g/dL (ref 6.5–8.1)

## 2017-02-21 LAB — URINALYSIS, MICROSCOPIC (REFLEX)

## 2017-02-21 LAB — CBC WITH DIFFERENTIAL/PLATELET
BASOS ABS: 0 10*3/uL (ref 0.0–0.1)
Basophils Relative: 1 %
EOS ABS: 0.1 10*3/uL (ref 0.0–0.7)
EOS PCT: 2 %
HCT: 34.8 % — ABNORMAL LOW (ref 36.0–46.0)
Hemoglobin: 11.6 g/dL — ABNORMAL LOW (ref 12.0–15.0)
LYMPHS ABS: 2 10*3/uL (ref 0.7–4.0)
Lymphocytes Relative: 27 %
MCH: 30 pg (ref 26.0–34.0)
MCHC: 33.3 g/dL (ref 30.0–36.0)
MCV: 89.9 fL (ref 78.0–100.0)
Monocytes Absolute: 0.6 10*3/uL (ref 0.1–1.0)
Monocytes Relative: 8 %
Neutro Abs: 4.7 10*3/uL (ref 1.7–7.7)
Neutrophils Relative %: 62 %
PLATELETS: 471 10*3/uL — AB (ref 150–400)
RBC: 3.87 MIL/uL (ref 3.87–5.11)
RDW: 13.8 % (ref 11.5–15.5)
WBC: 7.4 10*3/uL (ref 4.0–10.5)

## 2017-02-21 LAB — PREGNANCY, URINE: PREG TEST UR: NEGATIVE

## 2017-02-21 MED ORDER — SODIUM CHLORIDE 0.9 % IV BOLUS (SEPSIS)
1000.0000 mL | Freq: Once | INTRAVENOUS | Status: AC
  Administered 2017-02-21: 1000 mL via INTRAVENOUS

## 2017-02-21 MED ORDER — KETOROLAC TROMETHAMINE 30 MG/ML IJ SOLN
30.0000 mg | Freq: Once | INTRAMUSCULAR | Status: AC
  Administered 2017-02-21: 30 mg via INTRAVENOUS
  Filled 2017-02-21: qty 1

## 2017-02-21 MED ORDER — ONDANSETRON 4 MG PO TBDP: 4.0000 mg | ORAL_TABLET | Freq: Three times a day (TID) | ORAL | 0 refills | Status: AC | PRN

## 2017-02-21 MED ORDER — ONDANSETRON HCL 4 MG/2ML IJ SOLN
4.0000 mg | Freq: Once | INTRAMUSCULAR | Status: AC
  Administered 2017-02-21: 4 mg via INTRAVENOUS
  Filled 2017-02-21: qty 2

## 2017-02-21 MED ORDER — TAMSULOSIN HCL 0.4 MG PO CAPS: 0.4000 mg | ORAL_CAPSULE | Freq: Every day | ORAL | 0 refills | Status: AC

## 2017-02-21 MED ORDER — OXYCODONE-ACETAMINOPHEN 5-325 MG PO TABS: 1.0000 | ORAL_TABLET | Freq: Four times a day (QID) | ORAL | 0 refills | Status: AC | PRN

## 2017-02-21 MED ORDER — MORPHINE SULFATE (PF) 4 MG/ML IV SOLN
4.0000 mg | Freq: Once | INTRAVENOUS | Status: AC
  Administered 2017-02-21: 4 mg via INTRAVENOUS
  Filled 2017-02-21: qty 1

## 2017-02-21 NOTE — ED Notes (Signed)
Pt reports having a kidney stone and stent placement on her left side in April. Pt was informed in May she also has a kidney stone on her right side, unsure of the size and plan. Patient here today because the pain has increased.

## 2017-02-21 NOTE — Discharge Instructions (Signed)
You have a another kidney stone in your right UVJ. As we discussed, call urology in the morning. Have had a stent previously and you may require another stents to please follow-up immediately with urology.

## 2017-02-21 NOTE — ED Notes (Signed)
Patient transported to CT 

## 2017-02-21 NOTE — ED Triage Notes (Signed)
Pt c/o right flank x 1 hr ago hx stones

## 2017-02-21 NOTE — ED Notes (Signed)
ED Provider at bedside. 

## 2017-02-21 NOTE — ED Provider Notes (Signed)
[facility] Provider Note   CSN: [id number] Arrival date & time: 02/21/17  1506     History   Chief Complaint Chief Complaint  Patient presents with  . Flank Pain    HPI Darlene Frazier is a 36 y.o. female.  HPI   Patient is 36 year old female with past medical history significant for fibromyalgia, and depression, bipolar, anxiety, resting tachycardia presenting today with right flank pain. I saw patient 3 weeks ago for left flank pain and was found to have an infected stone. Urology stented. They report that she also had a sore on the right-hand side. She reports now she has pain on the right-hand side. Patient had no nausea no vomiting no fevers. Patient has had chronic symptoms of cystitis.  Past Medical History:  Diagnosis Date  . Arthritis   . Bipolar 1 disorder (HCC)   . Borderline personality disorder   . Depression   . Dyslipidemia   . Environmental and seasonal allergies   . Exercise-induced asthma    no inhaler  . Fibromyalgia   . Frequency of urination   . GERD (gastroesophageal reflux disease)   . Hiatal hernia   . History of chronic gastritis   . History of kidney stones   . Hyperprolactinemia (HCC)    followed by dr doerr [facility] in [place])--  breast leak milk  . Hypertension   . Hypothyroidism   . IBS (irritable bowel syndrome)   . Insomnia   . Left flank pain   . Left ureteral stone   . Migraines   . Nephrolithiasis    bilateral non-obstructive per CT 01-24-2017  . Snores   . Tachycardia    consult cardiologist-- dr Laveda [facility] in [place])  01-26-2017 per pt schedule to wear holter moniter and get sleep study  . Urgency of urination   . Wears glasses     Patient Active Problem List   Diagnosis Date Noted  . S/P lumbar fusion 10/06/2016  . Cholelithiasis with acute or chronic cholecystitis 02/04/2013    Past Surgical History:  Procedure Laterality Date  . BREAST BIOPSY Right 2009   benign  . CYSTO/  URETERSCOPIC STONE EXTRACTION /  STENT PLACEMENT  2007  . CYSTOSCOPY WITH RETROGRADE PYELOGRAM, URETEROSCOPY AND STENT PLACEMENT Left 02/01/2017   Procedure: CYSTOSCOPY WITH RETROGRADE PYELOGRAM, URETEROSCOPY AND STENT PLACEMENT;  Surgeon: Malen GauzePatrick L McKenzie, MD;  [facility];  Service: Urology;  Laterality: Left;  . ESOPHAGOGASTRODUODENOSCOPY N/A 11/16/2015   Procedure: ESOPHAGOGASTRODUODENOSCOPY (EGD);  Surgeon: Dorena CookeyJohn Hayes, MD;  [facility];  Service: Endoscopy;  Laterality: N/A;  . FOOT SURGERY Left 1998   nodule removed  . LAPAROSCOPIC CHOLECYSTECTOMY  02/06/2013  . LUMBAR LAMINECTOMY/DECOMPRESSION MICRODISCECTOMY Left 06/03/2013   Procedure: LEFT LUMBAR FIVE SACRAL ONE LAMINECTOMY/DECOMPRESSION MICRODISCECTOMY ;  Surgeon: Lisbeth RenshawNeelesh Nundkumar, MD;  Location: [facility];  Service: Neurosurgery;  Laterality: Left;  LEFT L5S1 microdiskectomy  . POSTERIOR LUMBAR FUSION  10/06/2016   L5 -- S1    OB History    Gravida Para Term Preterm AB Living   0 0 0 0 0 0   SAB TAB Ectopic Multiple Live Births   0 0 0 0         Home Medications    Prior to Admission medications   Medication Sig Start Date End Date Taking? Authorizing Provider  bismuth subsalicylate (PEPTO BISMOL) 262 MG chewable tablet Chew 524 mg by mouth daily as needed for indigestion.  [provider]  citalopram (CELEXA) 20 MG tablet Take 20 mg by mouth every evening.    [provider]  Cranberry (SM CRANBERRY) 300 MG tablet Take 600 mg by mouth 2 (two) times daily.     [provider]  cyclobenzaprine (FLEXERIL) 10 MG tablet Take 10-20 mg by mouth 2 (two) times daily as needed for muscle spasms.    [provider]  dexlansoprazole (DEXILANT) 60 MG capsule Take 60 mg by mouth every morning.     [provider]  diazepam (VALIUM) 5 MG tablet Take 5 mg by mouth every 8 (eight) hours as needed for anxiety.  01/03/17   [provider]  doxylamine, Sleep, (UNISOM) 25 MG tablet Take 75 mg by mouth at bedtime.    [provider]  DULoxetine (CYMBALTA) 60 MG capsule Take 120 mg by mouth at bedtime.     [provider]  EPINEPHrine 0.3 mg/0.3 mL IJ SOAJ injection Inject 0.3 mg into the muscle as needed (allergic reaction).     [provider]  gabapentin (NEURONTIN) 300 MG capsule Take 1,800 mg by mouth at bedtime.     [provider]  gemfibrozil (LOPID) 600 MG tablet Take 600 mg by mouth every evening.     [provider]  hydrOXYzine (ATARAX/VISTARIL) 25 MG tablet Take 25 mg by mouth 2 (two) times daily as needed for itching.    [provider]  levothyroxine (SYNTHROID, LEVOTHROID) 50 MCG tablet Take 50 mcg by mouth every evening.     [provider]  loratadine (CLARITIN) 10 MG tablet Take 10 mg by mouth every evening.    [provider]  Lurasidone HCl (LATUDA) 60 MG TABS Take 60 mg by mouth daily with supper.    [provider]  metoprolol succinate (TOPROL-XL) 25 MG 24 hr tablet Take 25 mg by mouth every evening.     [provider]  ondansetron (ZOFRAN ODT) 4 MG disintegrating tablet Take 1 tablet (4 mg total) by mouth every 8 (eight) hours as needed for nausea or vomiting. 01/25/17   Mackuen, Courteney Lyn, MD  ondansetron (ZOFRAN ODT) 4 MG disintegrating tablet Take 1 tablet (4 mg total) by mouth every 8 (eight) hours as needed for nausea or vomiting. 02/21/17   Mackuen, Courteney Lyn, MD  oxyCODONE-acetaminophen (PERCOCET/ROXICET) 5-325 MG tablet Take 1 tablet by mouth every 6 (six) hours as needed for severe pain. 02/01/17   McKenzie, Mardene Celeste, MD  oxyCODONE-acetaminophen (PERCOCET/ROXICET) 5-325 MG tablet Take 1 tablet by mouth every 6 (six) hours as needed for severe pain. 02/21/17   Mackuen, Courteney Lyn, MD  polycarbophil (RA FIBER-TAB) 625 MG tablet Take 1,875 mg by mouth at bedtime.     [provider]    Polyvinyl Alcohol (LUBRICANT DROPS OP) Apply 1 drop to eye daily as needed (dry eyes).    [provider]  ranitidine (ZANTAC) 150 MG tablet Take 300 mg by mouth at bedtime.    [provider]  Simethicone (GAS-X PO) Take 1-2 tablets by mouth daily as needed (gas).    [provider]  tamsulosin (FLOMAX) 0.4 MG CAPS capsule Take 1 capsule (0.4 mg total) by mouth daily. Patient taking differently: Take 0.4 mg by mouth daily after supper.  01/25/17   Mackuen, Courteney Lyn, MD  tamsulosin (FLOMAX) 0.4 MG CAPS capsule Take 1 capsule (0.4 mg total) by mouth daily. 02/21/17   Mackuen, Courteney Lyn, MD  tolterodine (DETROL LA) 2 MG 24  hr capsule Take 2 mg by mouth every morning.     [provider]  topiramate (TOPAMAX) 50 MG tablet Take 100 mg by mouth every evening.     [provider]    Family History Family History  Problem Relation Age of Onset  . Hypertension Mother   . Bipolar disorder Mother   . Hypertension Father   . Depression Brother   . Alcohol abuse Brother   . Bipolar disorder Brother     Social History Social History  Substance Use Topics  . Smoking status: Never Smoker  . Smokeless tobacco: Never Used  . Alcohol use Yes     Comment: socially     Allergies   Other and Morphine and related   Review of Systems Review of Systems  Constitutional: Negative for activity change.  Respiratory: Negative for shortness of breath.   Cardiovascular: Negative for chest pain.  Gastrointestinal: Negative for abdominal pain.  Genitourinary: Positive for dysuria, flank pain and urgency.     Physical Exam Updated Vital Signs BP (!) 124/93   Pulse (!) 103   Temp 98.1 F (36.7 C) (Oral)   Resp 20   Ht 5\' 6"  (1.676 m)   Wt 131.5 kg (290 lb)   SpO2 96%   BMI 46.81 kg/m   Physical Exam  Constitutional: She is oriented to person, place, and time. She appears well-developed and well-nourished.  HENT:  Head: Normocephalic and  atraumatic.  Eyes: Right eye exhibits no discharge.  Cardiovascular:  Tachycardic.  Pulmonary/Chest: Effort normal and breath sounds normal. No respiratory distress. She has no wheezes.  Abdominal: Soft. She exhibits no distension. There is no tenderness.  Mild CVA tenderness right side.  Neurological: She is oriented to person, place, and time.  Skin: Skin is warm and dry. She is not diaphoretic.  Psychiatric: She has a normal mood and affect.  Nursing note and vitals reviewed.    ED Treatments / Results  Labs (all labs ordered are listed, but only abnormal results are displayed) Labs Reviewed  URINALYSIS, ROUTINE W REFLEX MICROSCOPIC - Abnormal; Notable for the following:       Result Value   APPearance CLOUDY (*)    Hgb urine dipstick LARGE (*)    All other components within normal limits  CBC WITH DIFFERENTIAL/PLATELET - Abnormal; Notable for the following:    Hemoglobin 11.6 (*)    HCT 34.8 (*)    Platelets 471 (*)    All other components within normal limits  COMPREHENSIVE METABOLIC PANEL - Abnormal; Notable for the following:    Glucose, Bld 126 (*)    Creatinine, Ser 1.10 (*)    Total Bilirubin 0.1 (*)    All other components within normal limits  URINALYSIS, MICROSCOPIC (REFLEX) - Abnormal; Notable for the following:    Bacteria, UA FEW (*)    Squamous Epithelial / LPF 0-5 (*)    All other components within normal limits  PREGNANCY, URINE    EKG  EKG Interpretation None       Radiology Ct Renal Stone Study  Result Date: 02/21/2017 CLINICAL DATA:  History of left renal stent increased pain on the right EXAM: CT ABDOMEN AND PELVIS WITHOUT CONTRAST TECHNIQUE: Multidetector CT imaging of the abdomen and pelvis was performed following the standard protocol without IV contrast. COMPARISON:  02/02/2017, 01/24/2017 FINDINGS: Lower chest: Lung bases demonstrate no acute consolidation or pleural effusion. Normal heart size. Hepatobiliary: Diffuse heterogenous  low-attenuation throughout the right hepatic lobe without discrete  mass. Scattered calcified granuloma. Surgical clips in the gallbladder fossa. No biliary dilatation. Pancreas: Unremarkable. No pancreatic ductal dilatation or surrounding inflammatory changes. Spleen: Normal in size without focal abnormality. Adrenals/Urinary Tract: Adrenal glands are within normal limits. Bilateral intrarenal calculi, on the left, a lower pole stone measures up to 5 mm on coronal views. In the lower pole of the right kidney, there is a 7 mm stone. Previously noted left ureteral stent has been removed. New mild right hydronephrosis and hydroureter, secondary to a 4 mm stone at the right UVJ. Bladder unremarkable. Stomach/Bowel: Stomach is within normal limits. Appendix appears normal. No evidence of bowel wall thickening, distention, or inflammatory changes. Vascular/Lymphatic: No significant vascular findings are present. No enlarged abdominal or pelvic lymph nodes. Reproductive: Uterus and bilateral adnexa are unremarkable. Other: No free air or free fluid.  Small fat in the umbilicus. Musculoskeletal: Posterior postsurgical changes at L5-S1. IMPRESSION: 1. Mild right hydronephrosis and hydroureter secondary to a 4 mm stone at the right UVJ. 2. Bilateral intrarenal calculi. Removal of left ureteral stent since radiograph 02/02/2017. Resolution of distal left ureteral stone. 3. Heterogenous low density within the right hepatic lobe, probably stable to 11/13/2015 and suggestive of heterogenous fatty infiltration. Electronically Signed   By: Jasmine Pang M.D.   On: 02/21/2017 16:33    Procedures Procedures (including critical care time)  Medications Ordered in ED Medications  sodium chloride 0.9 % bolus 1,000 mL (0 mLs Intravenous Stopped 02/21/17 1705)  ketorolac (TORADOL) 30 MG/ML injection 30 mg (30 mg Intravenous Given 02/21/17 1610)  ondansetron (ZOFRAN) injection 4 mg (4 mg Intravenous Given 02/21/17 1615)  morphine 4  MG/ML injection 4 mg (4 mg Intravenous Given 02/21/17 1705)     Initial Impression / Assessment and Plan / ED Course  I have reviewed the triage vital signs and the nursing notes.  Pertinent labs & imaging results that were available during my care of the patient were reviewed by me and considered in my medical decision making (see chart for details).     atient is 36 year old female with past medical history significant for fibromyalgia, and depression, bipolar, anxiety, resting tachycardia presenting today with right flank pain. Known stone in that area. We'll get CT to make sure that there is not another larger stone. We'll get UA for infection. We'll treat symptoms.  Stone in [employer]. Will have her call urology in am for follow up.  Taking PO and non febrile at the time. Urine has blood, no infectious signs.   Final Clinical Impressions(s) / ED Diagnoses   Final diagnoses:  Right flank pain    New Prescriptions Discharge Medication List as of 02/21/2017  5:40 PM    START taking these medications   Details  !! ondansetron (ZOFRAN ODT) 4 MG disintegrating tablet Take 1 tablet (4 mg total) by mouth every 8 (eight) hours as needed for nausea or vomiting., Starting Wed 02/21/2017, Print    !! oxyCODONE-acetaminophen (PERCOCET/ROXICET) 5-325 MG tablet Take 1 tablet by mouth every 6 (six) hours as needed for severe pain., Starting Wed 02/21/2017, Print    !! tamsulosin (FLOMAX) 0.4 MG CAPS capsule Take 1 capsule (0.4 mg total) by mouth daily., Starting Wed 02/21/2017, Print     !! - Potential duplicate medications found. Please discuss with provider.       Abelino Derrick, MD 02/21/17 2213

## 2017-02-27 ENCOUNTER — Ambulatory Visit: Payer: Medicare Other | Admitting: Physical Therapy

## 2017-03-02 NOTE — Addendum Note (Signed)
Addendum  created 03/02/17 [id number] by Sharee HolsterMassagee, Jyl Chico, MD   Sign clinical note

## 2017-03-07 ENCOUNTER — Ambulatory Visit: Payer: Medicare Other | Attending: Neurosurgery | Admitting: Physical Therapy

## 2017-03-07 ENCOUNTER — Encounter: Payer: Self-pay | Admitting: Physical Therapy

## 2017-03-07 DIAGNOSIS — R262 Difficulty in walking, not elsewhere classified: Secondary | ICD-10-CM | POA: Insufficient documentation

## 2017-03-07 DIAGNOSIS — M62830 Muscle spasm of back: Secondary | ICD-10-CM

## 2017-03-07 DIAGNOSIS — M5442 Lumbago with sciatica, left side: Secondary | ICD-10-CM | POA: Diagnosis present

## 2017-03-07 NOTE — Therapy (Signed)
[facility] [address] [place], [place], [address] Phone: [phone]   Fax:  [phone]  Physical Therapy Treatment  Patient Details  Name: Darlene Frazier MRN: [id number] Date of Birth: 10/02/1981 Referring Provider: Conchita Paris  Encounter Date: 03/07/2017      PT End of Session - 03/07/17 1429    Visit Number 8   Date for PT Re-Evaluation 03/27/17   PT Start Time 1345   PT Stop Time 1442   PT Time Calculation (min) 57 min   Activity Tolerance Patient tolerated treatment well   Behavior During Therapy [facility] for tasks assessed/performed      Past Medical History:  Diagnosis Date  . Arthritis   . Bipolar 1 disorder (HCC)   . Borderline personality disorder   . Depression   . Dyslipidemia   . Environmental and seasonal allergies   . Exercise-induced asthma    no inhaler  . Fibromyalgia   . Frequency of urination   . GERD (gastroesophageal reflux disease)   . Hiatal hernia   . History of chronic gastritis   . History of kidney stones   . Hyperprolactinemia (HCC)    followed by dr doerr [facility] in [place])--  breast leak milk  . Hypertension   . Hypothyroidism   . IBS (irritable bowel syndrome)   . Insomnia   . Left flank pain   . Left ureteral stone   . Migraines   . Nephrolithiasis    bilateral non-obstructive per CT 01-24-2017  . Snores   . Tachycardia    consult cardiologist-- dr Laveda Abbe [facility] in [place])  01-26-2017 per pt schedule to wear holter moniter and get sleep study  . Urgency of urination   . Wears glasses     Past Surgical History:  Procedure Laterality Date  . BREAST BIOPSY Right 2009   benign  . CYSTO/  URETERSCOPIC STONE EXTRACTION /  STENT PLACEMENT  2007  . CYSTOSCOPY WITH RETROGRADE PYELOGRAM, URETEROSCOPY AND STENT PLACEMENT Left 02/01/2017   Procedure: CYSTOSCOPY WITH RETROGRADE PYELOGRAM, URETEROSCOPY AND STENT PLACEMENT;  Surgeon:  Malen Gauze, MD;  Location: [facility];  Service: Urology;  Laterality: Left;  . ESOPHAGOGASTRODUODENOSCOPY N/A 11/16/2015   Procedure: ESOPHAGOGASTRODUODENOSCOPY (EGD);  Surgeon: Dorena Cookey, MD;  Location: [facility] END[facility];  Service: Endoscopy;  Laterality: N/A;  . FOOT SURGERY Left 1998   nodule removed  . LAPAROSCOPIC CHOLECYSTECTOMY  02/06/2013  . LUMBAR LAMINECTOMY/DECOMPRESSION MICRODISCECTOMY Left 06/03/2013   Procedure: LEFT LUMBAR FIVE SACRAL ONE LAMINECTOMY/DECOMPRESSION MICRODISCECTOMY ;  Surgeon: Lisbeth Renshaw, MD;  Location: [facility];  Service: Neurosurgery;  Laterality: Left;  LEFT L5S1 microdiskectomy  . POSTERIOR LUMBAR FUSION  10/06/2016   L5 -- S1    There were no vitals filed for this visit.      Subjective Assessment - 03/07/17 1348    Subjective "Its going" Pt reports leaning over yesterday and losing all sensation in her LE, but it did return once she stood up.   Currently in Pain? Yes   Pain Score 3    Pain Location Back                         [facility] Adult PT Treatment/Exercise - 03/07/17 0001      Lumbar Exercises: Aerobic   Elliptical Nustep level 5x76min   UBE (Upper Arm Bike) L2 41frd/2rev     Lumbar Exercises: Machines for Strengthening  Cybex Knee Extension 10lb 2x10   Cybex Knee Flexion 25lb 2x10   Leg Press 20# with ball squeeze for pelvic floor 2x15   Other Lumbar Machine Exercise row & lat pull 25lb 2x10     Lumbar Exercises: Standing   Shoulder Extension 10 reps;Theraband  x2   Theraband Level (Shoulder Extension) Level 4 (Blue)   Other Standing Lumbar Exercises bosu step up with hand hold x 10bil   Other Standing Lumbar Exercises straight arm pull downs 20lb 2x15     Moist Heat Therapy   Number Minutes Moist Heat 15 Minutes   Moist Heat Location Lumbar Spine     Electrical Stimulation   Electrical Stimulation Location low back   Electrical Stimulation Action IFC   Electrical Stimulation  Parameters supine   Electrical Stimulation Goals Pain                  PT Short Term Goals - 02/15/17 1523      PT SHORT TERM GOAL #1   Title independent with initial HEP   Status Achieved           PT Long Term Goals - 03/07/17 1429      PT LONG TERM GOAL #1   Title walk 500 feet with a SPC with 1 rest   Status Achieved     PT LONG TERM GOAL #2   Title increase lumbar ROM 25%   Status On-going     PT LONG TERM GOAL #3   Title decrease pain 50%   Status On-going     PT LONG TERM GOAL #4   Title report able to stand 5 minutes   Status On-going               Plan - 03/07/17 1433    Clinical Impression Statement Pt and to complete all of today's exercises well. Pt does fatigue quick with minium exertion. No reports of increase pain.   Rehab Potential Fair   PT Frequency 2x / week   PT Duration 6 weeks   PT Next Visit Plan continue gait, core and LE strength      Patient will benefit from skilled therapeutic intervention in order to improve the following deficits and impairments:  Cardiopulmonary status limiting activity, Decreased activity tolerance, Decreased mobility, Decreased range of motion, Decreased strength, Difficulty walking, Increased muscle spasms, Impaired flexibility, Postural dysfunction, Improper body mechanics, Pain  Visit Diagnosis: Acute bilateral low back pain with left-sided sciatica  Muscle spasm of back  Difficulty in walking, not elsewhere classified     Problem List Patient Active Problem List   Diagnosis Date Noted  . S/P lumbar fusion 10/06/2016  . Cholelithiasis with acute or chronic cholecystitis 02/04/2013    Grayce Sessionsonald G Pemberton, PTA 03/07/2017, 2:36 PM  [facility] [address] [place], [place], [address] Phone: [phone]   Fax:  [phone]  Name: Darlene Frazier MRN: [id number] Date of Birth: 02/15/1981

## 2017-03-09 ENCOUNTER — Encounter: Payer: Self-pay | Admitting: Physical Therapy

## 2017-03-09 ENCOUNTER — Ambulatory Visit: Payer: Medicare Other | Admitting: Physical Therapy

## 2017-03-09 DIAGNOSIS — M5442 Lumbago with sciatica, left side: Secondary | ICD-10-CM

## 2017-03-09 DIAGNOSIS — R262 Difficulty in walking, not elsewhere classified: Secondary | ICD-10-CM

## 2017-03-09 DIAGNOSIS — M62830 Muscle spasm of back: Secondary | ICD-10-CM

## 2017-03-09 NOTE — Therapy (Signed)
[place], [place], [address] Phone: [phone]   Fax:  [phone]  Physical Therapy Treatment  Patient Details  Name: Darlene Frazier MRN: [id number] Date of Birth: 10-11-1980 Referring Provider: Kathyrn Sheriff  Encounter Date: 03/09/2017      PT End of Session - 03/09/17 0926    Visit Number 9   Date for PT Re-Evaluation 03/27/17   PT Start Time 0910   PT Stop Time 1010   PT Time Calculation (min) 60 min      Past Medical History:  Diagnosis Date  . Arthritis   . Bipolar 1 disorder ([place])   . Borderline personality disorder   . Depression   . Dyslipidemia   . Environmental and seasonal allergies   . Exercise-induced asthma    no inhaler  . Fibromyalgia   . Frequency of urination   . GERD (gastroesophageal reflux disease)   . Hiatal hernia   . History of chronic gastritis   . History of kidney stones   . Hyperprolactinemia ([place])    followed by dr doerr [facility] endocrinology in [place])--  breast leak milk  . Hypertension   . Hypothyroidism   . IBS (irritable bowel syndrome)   . Insomnia   . Left flank pain   . Left ureteral stone   . Migraines   . Nephrolithiasis    bilateral non-obstructive per CT 01-24-2017  . Snores   . Tachycardia    consult cardiologist-- dr Edman [facility] cardiology in [place])  01-26-2017 per pt schedule to wear holter moniter and get sleep study  . Urgency of urination   . Wears glasses     Past Surgical History:  Procedure Laterality Date  . BREAST BIOPSY Right 2009   benign  . CYSTO/  URETERSCOPIC STONE EXTRACTION /  STENT PLACEMENT  2007  . CYSTOSCOPY WITH RETROGRADE PYELOGRAM, URETEROSCOPY AND STENT PLACEMENT Left 02/01/2017   Procedure: CYSTOSCOPY WITH RETROGRADE PYELOGRAM, URETEROSCOPY AND STENT PLACEMENT;  Surgeon: Cleon Gustin, MD;  Location: [facility];  Service: Urology;  Laterality: Left;  .  ESOPHAGOGASTRODUODENOSCOPY N/A 11/16/2015   Procedure: ESOPHAGOGASTRODUODENOSCOPY (EGD);  Surgeon: Teena Irani, MD;  Location: [facility] ENDOSCOPY;  Service: Endoscopy;  Laterality: N/A;  . FOOT SURGERY Left 1998   nodule removed  . LAPAROSCOPIC CHOLECYSTECTOMY  02/06/2013  . LUMBAR LAMINECTOMY/DECOMPRESSION MICRODISCECTOMY Left 06/03/2013   Procedure: LEFT LUMBAR FIVE SACRAL ONE LAMINECTOMY/DECOMPRESSION MICRODISCECTOMY ;  Surgeon: Consuella Lose, MD;  Location: [place] [facility];  Service: Neurosurgery;  Laterality: Left;  LEFT L5S1 microdiskectomy  . POSTERIOR LUMBAR FUSION  10/06/2016   L5 -- S1    There were no vitals filed for this visit.      Subjective Assessment - 03/09/17 0911    Subjective getting stronger, walked around [employer] yesterday vs using cart. 30-40% better with func pain about the same. Bend over tub and legs went numb- when stood up feeling returned ( told pt to call MD if this happens again)   Currently in Pain? Yes   Pain Score 4    Pain Location Back            [facility] PT Assessment - 03/09/17 0001      AROM   Overall AROM Comments decreased 50% through out     Strength   Overall Strength Comments LE 4/5 with quick fatigue  [place] Adult PT Treatment/Exercise - 03/09/17 0001      Lumbar Exercises: Aerobic   Elliptical Nustep level 5x62mn     Lumbar Exercises: Machines for Strengthening   Cybex Lumbar Extension black band 2 sets 15   Cybex Knee Extension 10lb 3x10   Cybex Knee Flexion 25lb 2x15   Leg Press 20# with ball squeeze for pelvic floor 2x15   Other Lumbar Machine Exercise row & lat pull 25lb 2x15     Lumbar Exercises: Standing   Other Standing Lumbar Exercises scap stab on pulleys 10# 15 each   Other Standing Lumbar Exercises wt ball OH ext and trunk rotation 15 each                  PT Short Term Goals - 02/15/17 1523      PT SHORT TERM GOAL #1   Title independent with initial HEP   Status Achieved            PT Long Term Goals - 03/09/17 06468     PT LONG TERM GOAL #1   Title walk 500 feet with a SPC with 1 rest   Status Partially Met     PT LONG TERM GOAL #2   Title increase lumbar ROM 25%   Status Achieved     PT LONG TERM GOAL #3   Title decrease pain 50%   Status Partially Met     PT LONG TERM GOAL #4   Title report able to stand 5 minutes   Baseline 1-2 min   Status On-going               Plan - 03/09/17 00321   Clinical Impression Statement pt progressing with strength and ROM goals however still limited. tol ex well but limited with standing ex and fatigues quickly. pt verb pain remaining the same   PT Next Visit Plan continue gait, core and LE strength      Patient will benefit from skilled therapeutic intervention in order to improve the following deficits and impairments:     Visit Diagnosis: Acute bilateral low back pain with left-sided sciatica  Muscle spasm of back  Difficulty in walking, not elsewhere classified     Problem List Patient Active Problem List   Diagnosis Date Noted  . S/P lumbar fusion 10/06/2016  . Cholelithiasis with acute or chronic cholecystitis 02/04/2013    PAYSEUR,ANGIE PTA 03/09/2017, 9:34 AM  [place] [place] [address]Phone: [phone]  Fax:  [phone] Name: Darlene Frazier: [id number] of Birth: 228-Dec-1982

## 2017-03-13 ENCOUNTER — Ambulatory Visit: Payer: Medicare Other | Admitting: Physical Therapy

## 2017-03-13 ENCOUNTER — Encounter: Payer: Self-pay | Admitting: Physical Therapy

## 2017-03-13 DIAGNOSIS — M62830 Muscle spasm of back: Secondary | ICD-10-CM

## 2017-03-13 DIAGNOSIS — M5442 Lumbago with sciatica, left side: Secondary | ICD-10-CM | POA: Diagnosis not present

## 2017-03-13 DIAGNOSIS — R262 Difficulty in walking, not elsewhere classified: Secondary | ICD-10-CM

## 2017-03-13 NOTE — Therapy (Signed)
[place], [place], [address] Phone: [phone]   Fax:  [phone]  Physical Therapy Treatment  Patient Details  Name: Darlene Frazier MRN: [id number] Date of Birth: 1981-07-21 Referring Provider: Kathyrn Sheriff  Encounter Date: 03/13/2017      PT End of Session - 03/13/17 1559    Visit Number 10   Date for PT Re-Evaluation 03/27/17   PT Start Time 7353   PT Stop Time 1610   PT Time Calculation (min) 55 min   Activity Tolerance Patient tolerated treatment well   Behavior During Therapy [facility] for tasks assessed/performed      Past Medical History:  Diagnosis Date  . Arthritis   . Bipolar 1 disorder ([place])   . Borderline personality disorder   . Depression   . Dyslipidemia   . Environmental and seasonal allergies   . Exercise-induced asthma    no inhaler  . Fibromyalgia   . Frequency of urination   . GERD (gastroesophageal reflux disease)   . Hiatal hernia   . History of chronic gastritis   . History of kidney stones   . Hyperprolactinemia ([place])    followed by dr doerr [facility] endocrinology in [place])--  breast leak milk  . Hypertension   . Hypothyroidism   . IBS (irritable bowel syndrome)   . Insomnia   . Left flank pain   . Left ureteral stone   . Migraines   . Nephrolithiasis    bilateral non-obstructive per CT 01-24-2017  . Snores   . Tachycardia    consult cardiologist-- dr Edman [facility] cardiology in [place])  01-26-2017 per pt schedule to wear holter moniter and get sleep study  . Urgency of urination   . Wears glasses     Past Surgical History:  Procedure Laterality Date  . BREAST BIOPSY Right 2009   benign  . CYSTO/  URETERSCOPIC STONE EXTRACTION /  STENT PLACEMENT  2007  . CYSTOSCOPY WITH RETROGRADE PYELOGRAM, URETEROSCOPY AND STENT PLACEMENT Left 02/01/2017   Procedure: CYSTOSCOPY WITH RETROGRADE PYELOGRAM, URETEROSCOPY AND STENT PLACEMENT;  Surgeon:  Cleon Gustin, MD;  Location[facility];  Service: Urology;  Laterality: Left;  . ESOPHAGOGASTRODUODENOSCOPY N/A 11/16/2015   Procedure: ESOPHAGOGASTRODUODENOSCOPY (EGD);  Surgeon: Teena Irani, MD;  Location: [facility] ENDOSCOPY;  Service: Endoscopy;  Laterality: N/A;  . FOOT SURGERY Left 1998   nodule removed  . LAPAROSCOPIC CHOLECYSTECTOMY  02/06/2013  . LUMBAR LAMINECTOMY/DECOMPRESSION MICRODISCECTOMY Left 06/03/2013   Procedure: LEFT LUMBAR FIVE SACRAL ONE LAMINECTOMY/DECOMPRESSION MICRODISCECTOMY ;  Surgeon: Consuella Lose, MD;  Location: [place] [facility];  Service: Neurosurgery;  Laterality: Left;  LEFT L5S1 microdiskectomy  . POSTERIOR LUMBAR FUSION  10/06/2016   L5 -- S1    There were no vitals filed for this visit.      Subjective Assessment - 03/13/17 1516    Subjective "Still have the issue with the numbness in my led, going to see the surgeon tomorrow"   Currently in Pain? Yes   Pain Score 4    Pain Location Back   Pain Orientation Right                         [facility] Adult PT Treatment/Exercise - 03/13/17 0001      Lumbar Exercises: Aerobic   Elliptical Nustep level 5x30mn     Lumbar Exercises: Machines for Strengthening   Cybex Knee Extension 10lb 3x10   Cybex  Knee Flexion 35lb 2x15   Leg Press 30# with ball squeeze for pelvic floor 2x15   Other Lumbar Machine Exercise row & lat pull 25lb 2x15     Lumbar Exercises: Standing   Other Standing Lumbar Exercises wt ball OH ext and trunk rotation 15 each; 3lb deadlifts 2x10     Lumbar Exercises: Seated   Sit to Stand 10 reps  x2 from blue chair, no UE     Moist Heat Therapy   Number Minutes Moist Heat 15 Minutes   Moist Heat Location Lumbar Spine     Electrical Stimulation   Electrical Stimulation Location low back   Electrical Stimulation Action IFC   Electrical Stimulation Parameters supne   Electrical Stimulation Goals Pain                  PT Short Term Goals -  02/15/17 1523      PT SHORT TERM GOAL #1   Title independent with initial HEP   Status Achieved           PT Long Term Goals - 03/09/17 7654      PT LONG TERM GOAL #1   Title walk 500 feet with a SPC with 1 rest   Status Partially Met     PT LONG TERM GOAL #2   Title increase lumbar ROM 25%   Status Achieved     PT LONG TERM GOAL #3   Title decrease pain 50%   Status Partially Met     PT LONG TERM GOAL #4   Title report able to stand 5 minutes   Baseline 1-2 min   Status On-going               Plan - 03/13/17 1600    Clinical Impression Statement Pt demos good strength and ROM with all of today's activities, but reports a constant 4/10 pain. She fatigues quickly and require frequent rest breaks.    Rehab Potential Fair   PT Frequency 2x / week   PT Duration 6 weeks   PT Treatment/Interventions ADLs/Self Care Home Management;Electrical Stimulation;Cryotherapy;Moist Heat;Therapeutic activities;Therapeutic exercise;Neuromuscular re-education;Patient/family education;Manual techniques   PT Next Visit Plan continue gait, core and LE strength      Patient will benefit from skilled therapeutic intervention in order to improve the following deficits and impairments:  Cardiopulmonary status limiting activity, Decreased activity tolerance, Decreased mobility, Decreased range of motion, Decreased strength, Difficulty walking, Increased muscle spasms, Impaired flexibility, Postural dysfunction, Improper body mechanics, Pain  Visit Diagnosis: Acute bilateral low back pain with left-sided sciatica  Muscle spasm of back  Difficulty in walking, not elsewhere classified     Problem List Patient Active Problem List   Diagnosis Date Noted  . S/P lumbar fusion 10/06/2016  . Cholelithiasis with acute or chronic cholecystitis 02/04/2013    Scot Jun, PTA 03/13/2017, 4:01 PM  [place], [place], [address] Phone: [phone]   Fax:  [phone]  Name: Darlene Frazier MRN: [id number] Date of Birth: 22-Jul-1981

## 2017-03-16 ENCOUNTER — Ambulatory Visit: Payer: Medicare Other | Admitting: Physical Therapy

## 2017-04-10 ENCOUNTER — Ambulatory Visit: Payer: Medicare Other | Attending: Neurosurgery | Admitting: Physical Therapy

## 2017-04-10 ENCOUNTER — Encounter: Payer: Self-pay | Admitting: Physical Therapy

## 2017-04-10 DIAGNOSIS — M5442 Lumbago with sciatica, left side: Secondary | ICD-10-CM

## 2017-04-10 DIAGNOSIS — M62830 Muscle spasm of back: Secondary | ICD-10-CM

## 2017-04-10 DIAGNOSIS — R262 Difficulty in walking, not elsewhere classified: Secondary | ICD-10-CM

## 2017-04-10 NOTE — Addendum Note (Signed)
Addended by: Jearld LeschALBRIGHT, MICHAEL W on: 04/10/2017 03:43 PM   Modules accepted: Orders

## 2017-04-10 NOTE — Therapy (Signed)
[facility] [place] [address] [place], [place], [address] Phone: [phone]   Fax:  [phone]  Physical Therapy Treatment  Patient Details  Name: Darlene Frazier MRN: [id number] Date of Birth: [birth year] Referring Provider: Conchita Paris  Encounter Date: 04/10/2017      PT End of Session - 04/10/17 1455    Visit Number 11   PT Start Time 1425   PT Stop Time 1455   PT Time Calculation (min) 30 min   Activity Tolerance Patient tolerated treatment well   Behavior During Therapy [facility] for tasks assessed/performed      Past Medical History:  Diagnosis Date  . Arthritis   . Bipolar 1 disorder ([facility])   . Borderline personality disorder   . Depression   . Dyslipidemia   . Environmental and seasonal allergies   . Exercise-induced asthma    no inhaler  . Fibromyalgia   . Frequency of urination   . GERD (gastroesophageal reflux disease)   . Hiatal hernia   . History of chronic gastritis   . History of kidney stones   . Hyperprolactinemia ([facility])    followed by dr doerr [facility] endocrinology in [place])--  breast leak milk  . Hypertension   . Hypothyroidism   . IBS (irritable bowel syndrome)   . Insomnia   . Left flank pain   . Left ureteral stone   . Migraines   . Nephrolithiasis    bilateral non-obstructive per CT 01-24-2017  . Snores   . Tachycardia    consult cardiologist-- dr Laveda Abbe [facility] cardiology in [place])  01-26-2017 per pt schedule to wear holter moniter and get sleep study  . Urgency of urination   . Wears glasses     Past Surgical History:  Procedure Laterality Date  . BREAST BIOPSY Right 2009   benign  . CYSTO/  URETERSCOPIC STONE EXTRACTION /  STENT PLACEMENT  2007  . CYSTOSCOPY WITH RETROGRADE PYELOGRAM, URETEROSCOPY AND STENT PLACEMENT Left 02/01/2017   Procedure: CYSTOSCOPY WITH RETROGRADE PYELOGRAM, URETEROSCOPY AND STENT PLACEMENT;  Surgeon: Malen Gauze, MD;  Location: [facility];  Service: Urology;  Laterality: Left;  . ESOPHAGOGASTRODUODENOSCOPY N/A 11/16/2015   Procedure: ESOPHAGOGASTRODUODENOSCOPY (EGD);  Surgeon: Dorena Cookey, MD;  Location: [facility] ENDOSCOPY;  Service: Endoscopy;  Laterality: N/A;  . FOOT SURGERY Left 1998   nodule removed  . LAPAROSCOPIC CHOLECYSTECTOMY  02/06/2013  . LUMBAR LAMINECTOMY/DECOMPRESSION MICRODISCECTOMY Left 06/03/2013   Procedure: LEFT LUMBAR FIVE SACRAL ONE LAMINECTOMY/DECOMPRESSION MICRODISCECTOMY ;  Surgeon: Lisbeth Renshaw, MD;  [facility];  Service: Neurosurgery;  Laterality: Left;  LEFT L5S1 microdiskectomy  . POSTERIOR LUMBAR FUSION  10/06/2016   L5 -- S1    There were no vitals filed for this visit.      Subjective Assessment - 04/10/17 1426    Subjective Pt enters clinic ambulating without AD "All right, getting over a horrific sunburn" Pt reports that the surgeon said nothing was wrong, he wanted to see if the numbness will work itself out.    Currently in Pain? Yes   Pain Score 5    Pain Location Back   Pain Orientation Left            [facility] PT Assessment - 04/10/17 0001      AROM   Overall AROM Comments decreased 50% through out, decreased 75% with extension     Strength   Overall Strength Comments LE 4/5  [facility] Treatment/Exercise - 04/10/17 0001      Ambulation/Gait   Stairs Yes   Stairs Assistance 7: Independent;6: Modified independent (Device/Increase time)   Stair Management Technique One rail Right;One rail Left;Alternating pattern   Number of Stairs 24   Height of Stairs 6   Gait Comments x2, crepitus in borh knees; fatigues quickly     Lumbar Exercises: Aerobic   Elliptical Nustep level 5x746min     Lumbar Exercises: Machines for Strengthening   Cybex Knee Flexion 35lb 2x15   Other Lumbar Machine Exercise row & lat pull 25lb 3x10                  PT Short Term Goals - 02/15/17 1523      PT SHORT TERM GOAL  #1   Title independent with initial HEP   Status Achieved           PT Long Term Goals - 04/10/17 1431      PT LONG TERM GOAL #1   Title walk 500 feet with a SPC with 1 rest   Status Achieved  reports not using SPC for over a month now     PT LONG TERM GOAL #4   Title report able to stand 5 minutes   Status On-going               Plan - 04/10/17 1456    Clinical Impression Statement Pt returns to clinic after 4 weeks, she reports going on vacation and going on a camping trip. She also stated that she has been on a camping trip. Pt became very tired after stair negotiation but was independent ambulating on them. no issues reported with machine level interventions. Denied modality post treatment.    Rehab Potential Fair   PT Frequency 2x / week   PT Duration 6 weeks   PT Treatment/Interventions ADLs/Self Care Home Management;Electrical Stimulation;Cryotherapy;Moist Heat;Therapeutic activities;Therapeutic exercise;Neuromuscular re-education;Patient/family education;Manual techniques   PT Next Visit Plan continue gait, core and LE strength      Patient will benefit from skilled therapeutic intervention in order to improve the following deficits and impairments:  Cardiopulmonary status limiting activity, Decreased activity tolerance, Decreased mobility, Decreased range of motion, Decreased strength, Difficulty walking, Increased muscle spasms, Impaired flexibility, Postural dysfunction, Improper body mechanics, Pain  Visit Diagnosis: Acute bilateral low back pain with left-sided sciatica  Muscle spasm of back  Difficulty in walking, not elsewhere classified     Problem List Patient Active Problem List   Diagnosis Date Noted  . S/P lumbar fusion 10/06/2016  . Cholelithiasis with acute or chronic cholecystitis 02/04/2013    Grayce Sessionsonald G Pemberton, PTA 04/10/2017, 2:59 PM  [facility] [address] [place], [place], [address] Phone: [phone]   Fax:  [phone]  Name: Darlene Frazier MRN: [id number] Date of Birth: 08/22/1981

## 2017-04-12 ENCOUNTER — Encounter: Payer: Self-pay | Admitting: Physical Therapy

## 2017-04-12 ENCOUNTER — Ambulatory Visit: Payer: Medicare Other | Admitting: Physical Therapy

## 2017-04-12 DIAGNOSIS — M5442 Lumbago with sciatica, left side: Secondary | ICD-10-CM

## 2017-04-12 DIAGNOSIS — R262 Difficulty in walking, not elsewhere classified: Secondary | ICD-10-CM

## 2017-04-12 DIAGNOSIS — M62830 Muscle spasm of back: Secondary | ICD-10-CM

## 2017-04-12 NOTE — Therapy (Signed)
[facility] [address] [place], [place], [address] Phone: [phone]   Fax:  [phone]  Physical Therapy Treatment  Patient Details  Name: Darlene Frazier MRN: [id number] Date of Birth: 12/29/1980 Referring Provider: Conchita ParisNundkumar  Encounter Date: 04/12/2017      PT End of Session - 04/12/17 1512    Visit Number 12   Date for PT Re-Evaluation 05/11/17   PT Start Time 1440   PT Stop Time 1525   PT Time Calculation (min) 45 min      Past Medical History:  Diagnosis Date  . Arthritis   . Bipolar 1 disorder ([facility])   . Borderline personality disorder   . Depression   . Dyslipidemia   . Environmental and seasonal allergies   . Exercise-induced asthma    no inhaler  . Fibromyalgia   . Frequency of urination   . GERD (gastroesophageal reflux disease)   . Hiatal hernia   . History of chronic gastritis   . History of kidney stones   . Hyperprolactinemia ([facility])    followed by dr doerr [facility] in [place])--  breast leak milk  . Hypertension   . Hypothyroidism   . IBS (irritable bowel syndrome)   . Insomnia   . Left flank pain   . Left ureteral stone   . Migraines   . Nephrolithiasis    bilateral non-obstructive per CT 01-24-2017  . Snores   . Tachycardia    consult cardiologist-- [facility] [place])  01-26-2017 per pt schedule to wear holter moniter and get sleep study  . Urgency of urination   . Wears glasses     Past Surgical History:  Procedure Laterality Date  . BREAST BIOPSY Right 2009   benign  . CYSTO/  URETERSCOPIC STONE EXTRACTION /  STENT PLACEMENT  2007  . CYSTOSCOPY WITH RETROGRADE PYELOGRAM, URETEROSCOPY AND STENT PLACEMENT Left 02/01/2017   Procedure: CYSTOSCOPY WITH RETROGRADE PYELOGRAM, URETEROSCOPY AND STENT PLACEMENT;  Surgeon: Malen GauzePatrick L McKenzie, MD;  [facility];  Service: Urology;  Laterality: Left;  .  ESOPHAGOGASTRODUODENOSCOPY N/A 11/16/2015   Procedure: ESOPHAGOGASTRODUODENOSCOPY (EGD);  Surgeon: Dorena CookeyJohn Hayes, MD;  [facility];  Service: Endoscopy;  Laterality: N/A;  . FOOT SURGERY Left 1998   nodule removed  . LAPAROSCOPIC CHOLECYSTECTOMY  02/06/2013  . LUMBAR LAMINECTOMY/DECOMPRESSION MICRODISCECTOMY Left 06/03/2013   Procedure: LEFT LUMBAR FIVE SACRAL ONE LAMINECTOMY/DECOMPRESSION MICRODISCECTOMY ;  Surgeon: Lisbeth RenshawNeelesh Nundkumar, MD;  Location: [facility];  Service: Neurosurgery;  Laterality: Left;  LEFT L5S1 microdiskectomy  . POSTERIOR LUMBAR FUSION  10/06/2016   L5 -- S1    There were no vitals filed for this visit.      Subjective Assessment - 04/12/17 1443    Subjective tired after last session, adding in more walking at home up to .28 in 15 min ( needed to rest)   Currently in Pain? Yes   Pain Score 5    Pain Location Back                         [facility] Adult PT Treatment/Exercise - 04/12/17 0001      Lumbar Exercises: Aerobic   Stationary Bike 5 min OFF   Elliptical Nustep level 5x16min   UBE (Upper Arm Bike) L 3 3 fwd/back     Lumbar Exercises: Machines for Strengthening   Cybex Lumbar Extension black band 2 sets 15  Other Lumbar Machine Exercise row & lat pull 25lb 3x10     Lumbar Exercises: Standing   Other Standing Lumbar Exercises sit to stand with UE 3# ex  5 ex   Other Standing Lumbar Exercises 5# hip 3 way on pulleys 12 times each                PT Education - 04/12/17 1511    Education provided Yes   Education Details discussed walking program for less rest a nd same distance at faster rate   Person(s) Educated Patient   Methods Explanation   Comprehension Verbalized understanding          PT Short Term Goals - 02/15/17 1523      PT SHORT TERM GOAL #1   Title independent with initial HEP   Status Achieved           PT Long Term Goals - 04/10/17 1431      PT LONG TERM GOAL #1   Title walk 500 feet  with a SPC with 1 rest   Status Achieved  reports not using SPC for over a month now     PT LONG TERM GOAL #4   Title report able to stand 5 minutes   Status On-going               Plan - 04/12/17 1513    Clinical Impression Statement pt tolerated strengthening ex and cardio without increased pain just c/o muscle soreness, SOB and fatigue. standing ex caused more soreness and fatigue- cuing to engage core    PT Next Visit Plan continue gait, CORE and LE strength      Patient will benefit from skilled therapeutic intervention in order to improve the following deficits and impairments:     Visit Diagnosis: Acute bilateral low back pain with left-sided sciatica  Muscle spasm of back  Difficulty in walking, not elsewhere classified     Problem List Patient Active Problem List   Diagnosis Date Noted  . S/P lumbar fusion 10/06/2016  . Cholelithiasis with acute or chronic cholecystitis 02/04/2013    PAYSEUR,ANGIE PTA 04/12/2017, 3:16 PM  [facility] [address] [place], [place], [address] Phone: [phone]   Fax:  [phone]  Name: Darlene Frazier MRN: [id number] Date of Birth: 11/13/1980

## 2017-04-17 ENCOUNTER — Encounter: Payer: Self-pay | Admitting: Physical Therapy

## 2017-04-17 ENCOUNTER — Ambulatory Visit: Payer: Medicare Other | Admitting: Physical Therapy

## 2017-04-17 DIAGNOSIS — R262 Difficulty in walking, not elsewhere classified: Secondary | ICD-10-CM

## 2017-04-17 DIAGNOSIS — M5442 Lumbago with sciatica, left side: Secondary | ICD-10-CM

## 2017-04-17 DIAGNOSIS — M62830 Muscle spasm of back: Secondary | ICD-10-CM

## 2017-04-17 NOTE — Therapy (Signed)
[facility] [address] [place], [place], [address] Phone: [phone]   Fax:  [phone]  Physical Therapy Treatment  Patient Details  Name: Darlene Frazier MRN: [id number] Date of Birth: 02/26/1981 Referring Provider: Conchita ParisNundkumar  Encounter Date: 04/17/2017      PT End of Session - 04/17/17 1258    Visit Number 13   Date for PT Re-Evaluation 05/11/17   PT Start Time 1225   PT Stop Time 1310   PT Time Calculation (min) 45 min      Past Medical History:  Diagnosis Date  . Arthritis   . Bipolar 1 disorder (HCC)   . Borderline personality disorder   . Depression   . Dyslipidemia   . Environmental and seasonal allergies   . Exercise-induced asthma    no inhaler  . Fibromyalgia   . Frequency of urination   . GERD (gastroesophageal reflux disease)   . Hiatal hernia   . History of chronic gastritis   . History of kidney stones   . Hyperprolactinemia (HCC)    followed by dr doerr [facility] in [place])--  breast leak milk  . Hypertension   . Hypothyroidism   . IBS (irritable bowel syndrome)   . Insomnia   . Left flank pain   . Left ureteral stone   . Migraines   . Nephrolithiasis    bilateral non-obstructive per CT 01-24-2017  . Snores   . Tachycardia    consult cardiologist-- dr Laveda Abbejunacadhwalla [facility] in [place])  01-26-2017 per pt schedule to wear holter moniter and get sleep study  . Urgency of urination   . Wears glasses     Past Surgical History:  Procedure Laterality Date  . BREAST BIOPSY Right 2009   benign  . CYSTO/  URETERSCOPIC STONE EXTRACTION /  STENT PLACEMENT  2007  . CYSTOSCOPY WITH RETROGRADE PYELOGRAM, URETEROSCOPY AND STENT PLACEMENT Left 02/01/2017   Procedure: CYSTOSCOPY WITH RETROGRADE PYELOGRAM, URETEROSCOPY AND STENT PLACEMENT;  Surgeon: Malen GauzePatrick L McKenzie, MD;  [facility];  Service: Urology;  Laterality: Left;  .  ESOPHAGOGASTRODUODENOSCOPY N/A 11/16/2015   Procedure: ESOPHAGOGASTRODUODENOSCOPY (EGD);  Surgeon: Dorena CookeyJohn Hayes, MD;  [facility];  Service: Endoscopy;  Laterality: N/A;  . FOOT SURGERY Left 1998   nodule removed  . LAPAROSCOPIC CHOLECYSTECTOMY  02/06/2013  . LUMBAR LAMINECTOMY/DECOMPRESSION MICRODISCECTOMY Left 06/03/2013   Procedure: LEFT LUMBAR FIVE SACRAL ONE LAMINECTOMY/DECOMPRESSION MICRODISCECTOMY ;  Surgeon: Lisbeth RenshawNeelesh Nundkumar, MD;  Location: [facility];  Service: Neurosurgery;  Laterality: Left;  LEFT L5S1 microdiskectomy  . POSTERIOR LUMBAR FUSION  10/06/2016   L5 -- S1    There were no vitals filed for this visit.      Subjective Assessment - 04/17/17 1226    Subjective walked .33 in 8 min with 1 break inside. pain still about the same but building strength   Currently in Pain? Yes   Pain Score 5    Pain Location Back                         [facility] Adult PT Treatment/Exercise - 04/17/17 0001      Lumbar Exercises: Aerobic   Stationary Bike Elliptical    Elliptical Nustep level 5x226min   UBE (Upper Arm Bike) L 3 3 fwd/back     Lumbar Exercises: Machines for Strengthening   Cybex Knee Extension 10lb 3x10   Cybex Knee Flexion 35lb 2x15  Other Lumbar Machine Exercise row & lat pull 25lb 2 sets 15     Lumbar Exercises: Standing   Other Standing Lumbar Exercises 5# scap stab 3 way 15 times each   Other Standing Lumbar Exercises 5# hip 3 way on pulleys 15 times each                  PT Short Term Goals - 02/15/17 1523      PT SHORT TERM GOAL #1   Title independent with initial HEP   Status Achieved           PT Long Term Goals - 04/10/17 1431      PT LONG TERM GOAL #1   Title walk 500 feet with a SPC with 1 rest   Status Achieved  reports not using SPC for over a month now     PT LONG TERM GOAL #4   Title report able to stand 5 minutes   Status On-going               Plan - 04/17/17 1301    Clinical  Impression Statement pt fatigued easily esp in hips with ther ex today   PT Next Visit Plan continue gait, CORE and LE strength      Patient will benefit from skilled therapeutic intervention in order to improve the following deficits and impairments:     Visit Diagnosis: Acute bilateral low back pain with left-sided sciatica  Muscle spasm of back  Difficulty in walking, not elsewhere classified     Problem List Patient Active Problem List   Diagnosis Date Noted  . S/P lumbar fusion 10/06/2016  . Cholelithiasis with acute or chronic cholecystitis 02/04/2013    PAYSEUR,ANGIE PTA 04/17/2017, 1:01 PM  [facility] [address] [place], [place], [address] Phone: [phone]   Fax:  [phone]  Name: Darlene Frazier MRN: [id number] Date of Birth: [birth year]

## 2017-04-19 ENCOUNTER — Encounter: Payer: Self-pay | Admitting: Physical Therapy

## 2017-04-19 ENCOUNTER — Ambulatory Visit: Payer: Medicare Other | Admitting: Physical Therapy

## 2017-04-19 DIAGNOSIS — M5442 Lumbago with sciatica, left side: Secondary | ICD-10-CM | POA: Diagnosis not present

## 2017-04-19 DIAGNOSIS — M62830 Muscle spasm of back: Secondary | ICD-10-CM

## 2017-04-19 DIAGNOSIS — R262 Difficulty in walking, not elsewhere classified: Secondary | ICD-10-CM

## 2017-04-19 NOTE — Therapy (Signed)
[place], [place], [address] Phone: [phone]   Fax:  [phone]  Physical Therapy Treatment  Patient Details  Name: Darlene Frazier MRN: [id number] Date of Birth: October 22, 1980 Referring Provider: Kathyrn Sheriff  Encounter Date: 04/19/2017      PT End of Session - 04/19/17 1512    Visit Number 14   Date for PT Re-Evaluation 05/11/17   PT Start Time 1440   PT Stop Time 1525   PT Time Calculation (min) 45 min      Past Medical History:  Diagnosis Date  . Arthritis   . Bipolar 1 disorder ([place])   . Borderline personality disorder   . Depression   . Dyslipidemia   . Environmental and seasonal allergies   . Exercise-induced asthma    no inhaler  . Fibromyalgia   . Frequency of urination   . GERD (gastroesophageal reflux disease)   . Hiatal hernia   . History of chronic gastritis   . History of kidney stones   . Hyperprolactinemia ([place])    followed by dr doerr [facility] endocrinology in [place])--  breast leak milk  . Hypertension   . Hypothyroidism   . IBS (irritable bowel syndrome)   . Insomnia   . Left flank pain   . Left ureteral stone   . Migraines   . Nephrolithiasis    bilateral non-obstructive per CT 01-24-2017  . Snores   . Tachycardia    consult cardiologist-- dr Edman [address] [facility] cardiology in [place])  01-26-2017 per pt schedule to wear holter moniter and get sleep study  . Urgency of urination   . Wears glasses     Past Surgical History:  Procedure Laterality Date  . BREAST BIOPSY Right 2009   benign  . CYSTO/  URETERSCOPIC STONE EXTRACTION /  STENT PLACEMENT  2007  . CYSTOSCOPY WITH RETROGRADE PYELOGRAM, URETEROSCOPY AND STENT PLACEMENT Left 02/01/2017   Procedure: CYSTOSCOPY WITH RETROGRADE PYELOGRAM, URETEROSCOPY AND STENT PLACEMENT;  Surgeon: Cleon Gustin, MD;  [facility];  Service: Urology;  Laterality: Left;  .  ESOPHAGOGASTRODUODENOSCOPY N/A 11/16/2015   Procedure: ESOPHAGOGASTRODUODENOSCOPY (EGD);  Surgeon: Teena Irani, MD;  [facility];  Service: Endoscopy;  Laterality: N/A;  . FOOT SURGERY Left 1998   nodule removed  . LAPAROSCOPIC CHOLECYSTECTOMY  02/06/2013  . LUMBAR LAMINECTOMY/DECOMPRESSION MICRODISCECTOMY Left 06/03/2013   Procedure: LEFT LUMBAR FIVE SACRAL ONE LAMINECTOMY/DECOMPRESSION MICRODISCECTOMY ;  Surgeon: Consuella Lose, MD;  Location: [facility] [facility];  Service: Neurosurgery;  Laterality: Left;  LEFT L5S1 microdiskectomy  . POSTERIOR LUMBAR FUSION  10/06/2016   L5 -- S1    There were no vitals filed for this visit.      Subjective Assessment - 04/19/17 1440    Subjective very sore from last session   Currently in Pain? Yes   Pain Score 5    Pain Location Back                         [facility] Adult PT Treatment/Exercise - 04/19/17 0001      Lumbar Exercises: Aerobic   Elliptical Nustep L 5 8 min     Lumbar Exercises: Machines for Strengthening   Cybex Knee Extension 10lb 3x10   Cybex Knee Flexion 35lb 2x15   Other Lumbar Machine Exercise row & lat pull 25lb 2 sets 15   Other Lumbar Machine Exercise 3# UE and LE circuit  Lumbar Exercises: Supine   Ab Set 15 reps;3 seconds  ball and green tband   Clam 15 reps  green tband   Bridge 15 reps;3 seconds  with ball   Bridge Limitations bridge with ball squeeze 15   Other Supine Lumbar Exercises obl with ball 15 times each way                  PT Short Term Goals - 02/15/17 1523      PT SHORT TERM GOAL #1   Title independent with initial HEP   Status Achieved           PT Long Term Goals - 04/19/17 1511      PT LONG TERM GOAL #3   Title decrease pain 50%   Status Partially Met     PT LONG TERM GOAL #4   Title report able to stand 5 minutes   Baseline 3-4 min   Status On-going               Plan - 04/19/17 1513    Clinical Impression Statement working on  func strenth and endurance added core ex and func standing UE/LE ex and pt tolerated well, no pain just fatigue. progressing with goals. pt working on walking program at home   PT Next Visit Plan continue gait, CORE and LE strength      Patient will benefit from skilled therapeutic intervention in order to improve the following deficits and impairments:     Visit Diagnosis: Acute bilateral low back pain with left-sided sciatica  Muscle spasm of back  Difficulty in walking, not elsewhere classified     Problem List Patient Active Problem List   Diagnosis Date Noted  . S/P lumbar fusion 10/06/2016  . Cholelithiasis with acute or chronic cholecystitis 02/04/2013    PAYSEUR,ANGIE PTA 04/19/2017, 3:14 PM  [place], [place], [address] Phone: [phone]   Fax:  [phone]  Name: Darlene Frazier MRN: [id number] Date of Birth: 10-May-1981

## 2017-04-24 ENCOUNTER — Ambulatory Visit: Payer: Medicare Other | Admitting: Physical Therapy

## 2017-04-26 ENCOUNTER — Ambulatory Visit: Payer: Medicare Other | Admitting: Physical Therapy

## 2017-04-26 ENCOUNTER — Encounter: Payer: Self-pay | Admitting: Physical Therapy

## 2017-04-27 ENCOUNTER — Ambulatory Visit: Payer: Medicare Other | Admitting: Physical Therapy

## 2017-05-01 ENCOUNTER — Encounter: Payer: Self-pay | Admitting: Physical Therapy

## 2017-05-01 ENCOUNTER — Ambulatory Visit: Payer: Medicare Other | Admitting: Physical Therapy

## 2017-05-01 DIAGNOSIS — M5442 Lumbago with sciatica, left side: Secondary | ICD-10-CM | POA: Diagnosis not present

## 2017-05-01 DIAGNOSIS — M62830 Muscle spasm of back: Secondary | ICD-10-CM

## 2017-05-01 NOTE — Therapy (Signed)
[place], [place], [address] Phone: [phone]   Fax:  [phone]  Physical Therapy Treatment  Patient Details  Name: MALIYAH WILLETS MRN: [id number] Date of Birth: [birth year] Referring Provider: Kathyrn Sheriff  Encounter Date: 05/01/2017      PT End of Session - 05/01/17 1207    Visit Number 15   Date for PT Re-Evaluation 05/11/17   PT Start Time 2536   PT Stop Time 1230   PT Time Calculation (min) 45 min      Past Medical History:  Diagnosis Date  . Arthritis   . Bipolar 1 disorder ([place])   . Borderline personality disorder   . Depression   . Dyslipidemia   . Environmental and seasonal allergies   . Exercise-induced asthma    no inhaler  . Fibromyalgia   . Frequency of urination   . GERD (gastroesophageal reflux disease)   . Hiatal hernia   . History of chronic gastritis   . History of kidney stones   . Hyperprolactinemia ([place])    followed by dr doerr [facility] in [place])--  breast leak milk  . Hypertension   . Hypothyroidism   . IBS (irritable bowel syndrome)   . Insomnia   . Left flank pain   . Left ureteral stone   . Migraines   . Nephrolithiasis    bilateral non-obstructive per CT 01-24-2017  . Snores   . Tachycardia    consult cardiologist-- dr Edman Circle [facility] cardiology in [place])  01-26-2017 per pt schedule to wear holter moniter and get sleep study  . Urgency of urination   . Wears glasses     Past Surgical History:  Procedure Laterality Date  . BREAST BIOPSY Right 2009   benign  . CYSTO/  URETERSCOPIC STONE EXTRACTION /  STENT PLACEMENT  2007  . CYSTOSCOPY WITH RETROGRADE PYELOGRAM, URETEROSCOPY AND STENT PLACEMENT Left 02/01/2017   Procedure: CYSTOSCOPY WITH RETROGRADE PYELOGRAM, URETEROSCOPY AND STENT PLACEMENT;  Surgeon: Cleon Gustin, MD;  [facility];  Service: Urology;  Laterality: Left;  .  ESOPHAGOGASTRODUODENOSCOPY N/A 11/16/2015   Procedure: ESOPHAGOGASTRODUODENOSCOPY (EGD);  Surgeon: Teena Irani, MD;  Location: [facility];  Service: Endoscopy;  Laterality: N/A;  . FOOT SURGERY Left 1998   nodule removed  . LAPAROSCOPIC CHOLECYSTECTOMY  02/06/2013  . LUMBAR LAMINECTOMY/DECOMPRESSION MICRODISCECTOMY Left 06/03/2013   Procedure: LEFT LUMBAR FIVE SACRAL ONE LAMINECTOMY/DECOMPRESSION MICRODISCECTOMY ;  Surgeon: Consuella Lose, MD;  Location: [place] [facility];  Service: Neurosurgery;  Laterality: Left;  LEFT L5S1 microdiskectomy  . POSTERIOR LUMBAR FUSION  10/06/2016   L5 -- S1    There were no vitals filed for this visit.      Subjective Assessment - 05/01/17 1146    Subjective flu all last week   Currently in Pain? Yes   Pain Score 6    Pain Location Back                         [facility] Adult PT Treatment/Exercise - 05/01/17 0001      Lumbar Exercises: Aerobic   Elliptical Nustep L 5 6 min   UBE (Upper Arm Bike) L 3 3 fwd/back     Lumbar Exercises: Machines for Strengthening   Cybex Lumbar Extension black band 2 sets 15   Cybex Knee Extension 10lb 3x10   Cybex Knee Flexion 35lb 2x15   Other Lumbar Machine Exercise row &  lat pull 25lb 2 sets 15     Lumbar Exercises: Standing   Other Standing Lumbar Exercises step up 6inch fwd with opp leg ext 15 each  step up lat with opp leg abd 15 times each                  PT Short Term Goals - 02/15/17 1523      PT SHORT TERM GOAL #1   Title independent with initial HEP   Status Achieved           PT Long Term Goals - 04/19/17 1511      PT LONG TERM GOAL #3   Title decrease pain 50%   Status Partially Met     PT LONG TERM GOAL #4   Title report able to stand 5 minutes   Baseline 3-4 min   Status On-going               Plan - 05/01/17 1207    Clinical Impression Statement pt missed last week with the flu and still not feeling 100% , pt struggled with ex requiring increased  rest and decreased cardio time. Pt struggled with step ups d/t weakness.   PT Treatment/Interventions ADLs/Self Care Home Management;Electrical Stimulation;Cryotherapy;Moist Heat;Therapeutic activities;Therapeutic exercise;Neuromuscular re-education;Patient/family education;Manual techniques   PT Next Visit Plan continue gait, CORE and LE strength      Patient will benefit from skilled therapeutic intervention in order to improve the following deficits and impairments:  Cardiopulmonary status limiting activity, Decreased activity tolerance, Decreased mobility, Decreased range of motion, Decreased strength, Difficulty walking, Increased muscle spasms, Impaired flexibility, Postural dysfunction, Improper body mechanics, Pain  Visit Diagnosis: Acute bilateral low back pain with left-sided sciatica  Muscle spasm of back     Problem List Patient Active Problem List   Diagnosis Date Noted  . S/P lumbar fusion 10/06/2016  . Cholelithiasis with acute or chronic cholecystitis 02/04/2013    Trulee Hamstra,ANGIE PTA 05/01/2017, 12:09 PM  [place], [place], [address] Phone: [phone]   Fax:  [phone]  Name: NATHALIA WISMER MRN: [id number] Date of Birth: 13-Apr-1981

## 2017-05-03 ENCOUNTER — Ambulatory Visit: Payer: Medicare Other | Admitting: Physical Therapy

## 2017-05-04 ENCOUNTER — Ambulatory Visit: Payer: Medicare Other | Attending: Neurosurgery | Admitting: Physical Therapy

## 2017-05-04 DIAGNOSIS — M62830 Muscle spasm of back: Secondary | ICD-10-CM | POA: Insufficient documentation

## 2017-05-04 DIAGNOSIS — M5442 Lumbago with sciatica, left side: Secondary | ICD-10-CM | POA: Insufficient documentation

## 2017-05-04 DIAGNOSIS — R262 Difficulty in walking, not elsewhere classified: Secondary | ICD-10-CM | POA: Insufficient documentation

## 2017-05-08 ENCOUNTER — Encounter: Payer: Self-pay | Admitting: Physical Therapy

## 2017-05-08 ENCOUNTER — Ambulatory Visit: Payer: Medicare Other | Admitting: Physical Therapy

## 2017-05-08 DIAGNOSIS — R262 Difficulty in walking, not elsewhere classified: Secondary | ICD-10-CM

## 2017-05-08 DIAGNOSIS — M5442 Lumbago with sciatica, left side: Secondary | ICD-10-CM | POA: Diagnosis present

## 2017-05-08 DIAGNOSIS — M62830 Muscle spasm of back: Secondary | ICD-10-CM

## 2017-05-08 NOTE — Therapy (Signed)
[place], [place], [address] Phone: [phone]   Fax:  [phone]  Physical Therapy Treatment  Patient Details  Name: Darlene Frazier MRN: [id number] Date of Birth: 07-Nov-1980 Referring Provider: Kathyrn Sheriff  Encounter Date: 05/08/2017      PT End of Session - 05/08/17 1350    Visit Number 16   Date for PT Re-Evaluation 05/11/17   PT Start Time 1315   PT Stop Time 1405   PT Time Calculation (min) 50 min      Past Medical History:  Diagnosis Date  . Arthritis   . Bipolar 1 disorder ([place])   . Borderline personality disorder   . Depression   . Dyslipidemia   . Environmental and seasonal allergies   . Exercise-induced asthma    no inhaler  . Fibromyalgia   . Frequency of urination   . GERD (gastroesophageal reflux disease)   . Hiatal hernia   . History of chronic gastritis   . History of kidney stones   . Hyperprolactinemia ([place])    followed by dr doerr [facility] endocrinology in [place])--  breast leak milk  . Hypertension   . Hypothyroidism   . IBS (irritable bowel syndrome)   . Insomnia   . Left flank pain   . Left ureteral stone   . Migraines   . Nephrolithiasis    bilateral non-obstructive per CT 01-24-2017  . Snores   . Tachycardia    consult cardiologist-- dr Edman Circle [facility] cardiology in [place])  01-26-2017 per pt schedule to wear holter moniter and get sleep study  . Urgency of urination   . Wears glasses     Past Surgical History:  Procedure Laterality Date  . BREAST BIOPSY Right 2009   benign  . CYSTO/  URETERSCOPIC STONE EXTRACTION /  STENT PLACEMENT  2007  . CYSTOSCOPY WITH RETROGRADE PYELOGRAM, URETEROSCOPY AND STENT PLACEMENT Left 02/01/2017   Procedure: CYSTOSCOPY WITH RETROGRADE PYELOGRAM, URETEROSCOPY AND STENT PLACEMENT;  Surgeon: Cleon Gustin, MD;  Location: [facility];  Service: Urology;  Laterality: Left;  .  ESOPHAGOGASTRODUODENOSCOPY N/A 11/16/2015   Procedure: ESOPHAGOGASTRODUODENOSCOPY (EGD);  Surgeon: Teena Irani, MD;  [facility];  Service: Endoscopy;  Laterality: N/A;  . FOOT SURGERY Left 1998   nodule removed  . LAPAROSCOPIC CHOLECYSTECTOMY  02/06/2013  . LUMBAR LAMINECTOMY/DECOMPRESSION MICRODISCECTOMY Left 06/03/2013   Procedure: LEFT LUMBAR FIVE SACRAL ONE LAMINECTOMY/DECOMPRESSION MICRODISCECTOMY ;  Surgeon: Consuella Lose, MD;  Location: [place] [facility];  Service: Neurosurgery;  Laterality: Left;  LEFT L5S1 microdiskectomy  . POSTERIOR LUMBAR FUSION  10/06/2016   L5 -- S1    There were no vitals filed for this visit.      Subjective Assessment - 05/08/17 1310    Subjective still not feeling great saw MD yesterday and put on 2 inhalers and breathing tx   Currently in Pain? Yes   Pain Score 7    Pain Location Back                         [facility] Adult PT Treatment/Exercise - 05/08/17 0001      Lumbar Exercises: Aerobic   Elliptical Nustep L 5 6 min   UBE (Upper Arm Bike) L 3 3 fwd/back     Lumbar Exercises: Machines for Strengthening   Cybex Lumbar Extension black band 2 sets 15   Cybex Knee Extension 10lb 3x10   Cybex Knee  Flexion 35lb 2x15   Other Lumbar Machine Exercise row & lat pull 25lb 2 sets 15     Lumbar Exercises: Standing   Other Standing Lumbar Exercises red tband hip 3 way 15 times each     Lumbar Exercises: Supine   Ab Set 15 reps;3 seconds   Bridge 15 reps;3 seconds  with ball   Straight Leg Raise 10 reps;3 seconds  with abd and PPT   Other Supine Lumbar Exercises obl with ball 15 times each way                  PT Short Term Goals - 02/15/17 1523      PT SHORT TERM GOAL #1   Title independent with initial HEP   Status Achieved           PT Long Term Goals - 05/08/17 1318      PT LONG TERM GOAL #3   Title decrease pain 50%   Baseline overall 40% better- weather changes affects pain per pt   Status  Partially Met     PT LONG TERM GOAL #4   Title report able to stand 5 minutes   Baseline still reports 3-4 min   Status On-going               Plan - 05/08/17 1350    Clinical Impression Statement slow progression with goals last 2 weeks d/t illness, tolerated sesssion fair with SOB and coughing.    PT Treatment/Interventions ADLs/Self Care Home Management;Electrical Stimulation;Cryotherapy;Moist Heat;Therapeutic activities;Therapeutic exercise;Neuromuscular re-education;Patient/family education;Manual techniques   PT Next Visit Plan continue gait, CORE and LE strength- RENEWAL Due next session      Patient will benefit from skilled therapeutic intervention in order to improve the following deficits and impairments:  Cardiopulmonary status limiting activity, Decreased activity tolerance, Decreased mobility, Decreased range of motion, Decreased strength, Difficulty walking, Increased muscle spasms, Impaired flexibility, Postural dysfunction, Improper body mechanics, Pain  Visit Diagnosis: Acute bilateral low back pain with left-sided sciatica  Muscle spasm of back  Difficulty in walking, not elsewhere classified     Problem List Patient Active Problem List   Diagnosis Date Noted  . S/P lumbar fusion 10/06/2016  . Cholelithiasis with acute or chronic cholecystitis 02/04/2013    PAYSEUR,ANGIE PTA 05/08/2017, 1:52 PM  [place], [place], [address] Phone: [phone]   Fax:  [phone]  Name: Darlene Frazier MRN: [id number] Date of Birth: 16-Jun-1981

## 2017-05-10 ENCOUNTER — Ambulatory Visit: Payer: Medicare Other | Admitting: Physical Therapy

## 2017-05-10 ENCOUNTER — Encounter: Payer: Self-pay | Admitting: Physical Therapy

## 2017-05-10 DIAGNOSIS — M62830 Muscle spasm of back: Secondary | ICD-10-CM

## 2017-05-10 DIAGNOSIS — M5442 Lumbago with sciatica, left side: Secondary | ICD-10-CM

## 2017-05-10 DIAGNOSIS — R262 Difficulty in walking, not elsewhere classified: Secondary | ICD-10-CM

## 2017-05-10 NOTE — Therapy (Signed)
[place], [place], [address] Phone: [phone]   Fax:  [phone]  Physical Therapy Treatment  Patient Details  Name: Darlene Frazier MRN: [id number] Date of Birth: 23-Jun-1981 Referring Provider: Kathyrn Sheriff  Encounter Date: 05/10/2017      PT End of Session - 05/10/17 1428    Visit Number 17   Date for PT Re-Evaluation 06/08/17   PT Start Time 1400   PT Stop Time 1440   PT Time Calculation (min) 40 min      Past Medical History:  Diagnosis Date  . Arthritis   . Bipolar 1 disorder ([place])   . Borderline personality disorder   . Depression   . Dyslipidemia   . Environmental and seasonal allergies   . Exercise-induced asthma    no inhaler  . Fibromyalgia   . Frequency of urination   . GERD (gastroesophageal reflux disease)   . Hiatal hernia   . History of chronic gastritis   . History of kidney stones   . Hyperprolactinemia ([place])    followed by dr doerr [facility] endocrinology in [place])--  breast leak milk  . Hypertension   . Hypothyroidism   . IBS (irritable bowel syndrome)   . Insomnia   . Left flank pain   . Left ureteral stone   . Migraines   . Nephrolithiasis    bilateral non-obstructive per CT 01-24-2017  . Snores   . Tachycardia    consult cardiologist-- dr Edman Circle [facility] cardiology in [place])  01-26-2017 per pt schedule to wear holter moniter and get sleep study  . Urgency of urination   . Wears glasses     Past Surgical History:  Procedure Laterality Date  . BREAST BIOPSY Right 2009   benign  . CYSTO/  URETERSCOPIC STONE EXTRACTION /  STENT PLACEMENT  2007  . CYSTOSCOPY WITH RETROGRADE PYELOGRAM, URETEROSCOPY AND STENT PLACEMENT Left 02/01/2017   Procedure: CYSTOSCOPY WITH RETROGRADE PYELOGRAM, URETEROSCOPY AND STENT PLACEMENT;  Surgeon: Cleon Gustin, MD;  Location: [facility];  Service: Urology;  Laterality: Left;  .  ESOPHAGOGASTRODUODENOSCOPY N/A 11/16/2015   Procedure: ESOPHAGOGASTRODUODENOSCOPY (EGD);  Surgeon: Teena Irani, MD;  Location: [facility] END[facility]COPY;  Service: Endoscopy;  Laterality: N/A;  . FOOT SURGERY Left 1998   nodule removed  . LAPAROSCOPIC CHOLECYSTECTOMY  02/06/2013  . LUMBAR LAMINECTOMY/DECOMPRESSION MICRODISCECTOMY Left 06/03/2013   Procedure: LEFT LUMBAR FIVE SACRAL ONE LAMINECTOMY/DECOMPRESSION MICRODISCECTOMY ;  Surgeon: Consuella Lose, MD;  Location: [place] [facility];  Service: Neurosurgery;  Laterality: Left;  LEFT L5S1 microdiskectomy  . POSTERIOR LUMBAR FUSION  10/06/2016   L5 -- S1    There were no vitals filed for this visit.      Subjective Assessment - 05/10/17 1413    Subjective forgot inhaler today so decreased breathing   Currently in Pain? Yes   Pain Score 6    Pain Location Back                         [facility] Adult PT Treatment/Exercise - 05/10/17 0001      Lumbar Exercises: Aerobic   Elliptical Nustep L 5 8 min  slower pace   UBE (Upper Arm Bike) L 3 3 fwd/back  slower pace     Lumbar Exercises: Machines for Strengthening   Cybex Knee Extension 10lb 3x10   Cybex Knee Flexion 35lb 2x15     Lumbar Exercises: Standing   Other  Standing Lumbar Exercises standing upright row 20#,chest row 25#and obl 10# 15 each   Other Standing Lumbar Exercises hip pulleys 3 way 5# 15 times each                  PT Short Term Goals - 02/15/17 1523      PT SHORT TERM GOAL #1   Title independent with initial HEP   Status Achieved           PT Long Term Goals - 05/10/17 1430      PT LONG TERM GOAL #1   Title walk 500 feet with a SPC with 1 rest   Status Partially Met     PT LONG TERM GOAL #2   Title increase lumbar ROM 25%   Status Achieved     PT LONG TERM GOAL #3   Title decrease pain 50%   Status Partially Met     PT LONG TERM GOAL #4   Title report able to stand 5 minutes   Baseline did 5 min today pain increased to 8/10 and shaking  d/t weakness   Status Partially Met               Plan - 05/10/17 1430    Clinical Impression Statement progressing with goals, pt has had set back d/t illness and breathing issues prior to pt was making steady gains and increasing walking at home.   PT Next Visit Plan continue gait, CORE and LE strength- RENEWAL Done today      Patient will benefit from skilled therapeutic intervention in order to improve the following deficits and impairments:  Cardiopulmonary status limiting activity, Decreased activity tolerance, Decreased mobility, Decreased range of motion, Decreased strength, Difficulty walking, Increased muscle spasms, Impaired flexibility, Postural dysfunction, Improper body mechanics, Pain  Visit Diagnosis: Acute bilateral low back pain with left-sided sciatica  Muscle spasm of back  Difficulty in walking, not elsewhere classified     Problem List Patient Active Problem List   Diagnosis Date Noted  . S/P lumbar fusion 10/06/2016  . Cholelithiasis with acute or chronic cholecystitis 02/04/2013    Lashika Erker,ANGIE PTA 05/10/2017, 2:33 PM  [place], [place], [address] Phone: [phone]   Fax:  [phone]  Name: Darlene Frazier MRN: [id number] Date of Birth: Jul 21, 1981

## 2017-05-15 ENCOUNTER — Ambulatory Visit: Payer: Medicare Other | Admitting: Physical Therapy

## 2017-05-15 ENCOUNTER — Encounter: Payer: Self-pay | Admitting: Physical Therapy

## 2017-05-15 DIAGNOSIS — M62830 Muscle spasm of back: Secondary | ICD-10-CM

## 2017-05-15 DIAGNOSIS — R262 Difficulty in walking, not elsewhere classified: Secondary | ICD-10-CM

## 2017-05-15 DIAGNOSIS — M5442 Lumbago with sciatica, left side: Secondary | ICD-10-CM | POA: Diagnosis not present

## 2017-05-15 NOTE — Therapy (Signed)
[place], [place], [address] Phone: [phone]   Fax:  [phone]  Physical Therapy Treatment  Patient Details  Name: Darlene Frazier MRN: [id number] Date of Birth: November 30, 1980 Referring Provider: Kathyrn Frazier  Encounter Date: 05/15/2017      PT End of Session - 05/15/17 1252    Visit Number 18   Date for PT Re-Evaluation 06/08/17   PT Start Time 1215   PT Stop Time 1884   PT Time Calculation (min) 50 min      Past Medical History:  Diagnosis Date  . Arthritis   . Bipolar 1 disorder ([place])   . Borderline personality disorder   . Depression   . Dyslipidemia   . Environmental and seasonal allergies   . Exercise-induced asthma    no inhaler  . Fibromyalgia   . Frequency of urination   . GERD (gastroesophageal reflux disease)   . Hiatal hernia   . History of chronic gastritis   . History of kidney stones   . Hyperprolactinemia ([place])    followed by dr doerr [facility] endocrinology in [place])--  breast leak milk  . Hypertension   . Hypothyroidism   . IBS (irritable bowel syndrome)   . Insomnia   . Left flank pain   . Left ureteral stone   . Migraines   . Nephrolithiasis    bilateral non-obstructive per CT 01-24-2017  . Snores   . Tachycardia    consult cardiologist-- dr Edman Circle [facility] cardiology in [place])  01-26-2017 per pt schedule to wear holter moniter and get sleep study  . Urgency of urination   . Wears glasses     Past Surgical History:  Procedure Laterality Date  . BREAST BIOPSY Right 2009   benign  . CYSTO/  URETERSCOPIC STONE EXTRACTION /  STENT PLACEMENT  2007  . CYSTOSCOPY WITH RETROGRADE PYELOGRAM, URETEROSCOPY AND STENT PLACEMENT Left 02/01/2017   Procedure: CYSTOSCOPY WITH RETROGRADE PYELOGRAM, URETEROSCOPY AND STENT PLACEMENT;  Surgeon: Cleon Gustin, MD;  Location: [facility];  Service: Urology;  Laterality: Left;  .  ESOPHAGOGASTRODUODENOSCOPY N/A 11/16/2015   Procedure: ESOPHAGOGASTRODUODENOSCOPY (EGD);  Surgeon: Teena Irani, MD;  Location: [facility] ENDOSCOPY;  Service: Endoscopy;  Laterality: N/A;  . FOOT SURGERY Left 1998   nodule removed  . LAPAROSCOPIC CHOLECYSTECTOMY  02/06/2013  . LUMBAR LAMINECTOMY/DECOMPRESSION MICRODISCECTOMY Left 06/03/2013   Procedure: LEFT LUMBAR FIVE SACRAL ONE LAMINECTOMY/DECOMPRESSION MICRODISCECTOMY ;  Surgeon: Consuella Lose, MD;  Location: [place] [facility]UR[facility]S;  Service: Neurosurgery;  Laterality: Left;  LEFT L5S1 microdiskectomy  . POSTERIOR LUMBAR FUSION  10/06/2016   L5 -- S1    There were no vitals filed for this visit.      Subjective Assessment - 05/15/17 1216    Subjective breathing better but still coughing, have not resumed walking yet at home   Currently in Pain? Yes   Pain Score 5    Pain Location Back                         [facility] Adult PT Treatment/Exercise - 05/15/17 0001      Lumbar Exercises: Aerobic   Elliptical Nustep L 6 6 min   [place] 1.4-1.7 mph  .25 mph 9 min 45 sec   UBE (Upper Arm Bike) L 6 3 fwd/back     Lumbar Exercises: Machines for Strengthening   Cybex Lumbar Extension black band 2 sets 15  Cybex Knee Extension 10lb 3x10   Cybex Knee Flexion 35lb 2x15   Leg Press 30# with ball squeeze for pelvic floor 2x15   Other Lumbar Machine Exercise row & lat pull 25lb 2 sets 15     Lumbar Exercises: Standing   Other Standing Lumbar Exercises scap stab pulleys   Other Standing Lumbar Exercises obl pulleys                  PT Short Term Goals - 02/15/17 1523      PT SHORT TERM GOAL #1   Title independent with initial HEP   Status Achieved           PT Long Term Goals - 05/10/17 1430      PT LONG TERM GOAL #1   Title walk 500 feet with a SPC with 1 rest   Status Partially Met     PT LONG TERM GOAL #2   Title increase lumbar ROM 25%   Status Achieved     PT LONG TERM GOAL #3   Title decrease pain  50%   Status Partially Met     PT LONG TERM GOAL #4   Title report able to stand 5 minutes   Baseline did 5 min today pain increased to 8/10 and shaking d/t weakness   Status Partially Met               Plan - 05/15/17 1253    Clinical Impression Statement 2 min cardio with 1 seated rest break. pt was walking .3 miles in 8 min today .25 in over 9. stamina better today tahn last week   PT Treatment/Interventions ADLs/Self Care Home Management;Electrical Stimulation;Cryotherapy;Moist Heat;Therapeutic activities;Therapeutic exercise;Neuromuscular re-education;Patient/family education;Manual techniques   PT Next Visit Plan gait.core and LE strength      Patient will benefit from skilled therapeutic intervention in order to improve the following deficits and impairments:  Cardiopulmonary status limiting activity, Decreased activity tolerance, Decreased mobility, Decreased range of motion, Decreased strength, Difficulty walking, Increased muscle spasms, Impaired flexibility, Postural dysfunction, Improper body mechanics, Pain  Visit Diagnosis: Acute bilateral low back pain with left-sided sciatica  Muscle spasm of back  Difficulty in walking, not elsewhere classified     Problem List Patient Active Problem List   Diagnosis Date Noted  . S/P lumbar fusion 10/06/2016  . Cholelithiasis with acute or chronic cholecystitis 02/04/2013    [name],ANGIE PTA 05/15/2017, 12:55 PM  [place], [place], [address] Phone: [phone]   Fax:  [phone]  Name: Darlene Frazier MRN: [id number] Date of Birth: 1981/03/06

## 2017-05-17 ENCOUNTER — Encounter: Payer: Self-pay | Admitting: Physical Therapy

## 2017-05-17 ENCOUNTER — Ambulatory Visit: Payer: Medicare Other | Admitting: Physical Therapy

## 2017-05-17 DIAGNOSIS — R262 Difficulty in walking, not elsewhere classified: Secondary | ICD-10-CM

## 2017-05-17 DIAGNOSIS — M5442 Lumbago with sciatica, left side: Secondary | ICD-10-CM

## 2017-05-17 DIAGNOSIS — M62830 Muscle spasm of back: Secondary | ICD-10-CM

## 2017-05-17 NOTE — Therapy (Signed)
[place], [place], [address] Phone: [phone]   Fax:  [phone]  Physical Therapy Treatment  Patient Details  Name: Darlene Frazier MRN: [id number] Date of Birth: 02-07-1981 Referring Provider: Kathyrn Sheriff  Encounter Date: 05/17/2017      PT End of Session - 05/17/17 1106    Visit Number 19   Date for PT Re-Evaluation 06/08/17   PT Start Time 1055   PT Stop Time 1145   PT Time Calculation (min) 50 min      Past Medical History:  Diagnosis Date  . Arthritis   . Bipolar 1 disorder ([place])   . Borderline personality disorder   . Depression   . Dyslipidemia   . Environmental and seasonal allergies   . Exercise-induced asthma    no inhaler  . Fibromyalgia   . Frequency of urination   . GERD (gastroesophageal reflux disease)   . Hiatal hernia   . History of chronic gastritis   . History of kidney stones   . Hyperprolactinemia ([place])    followed by dr doerr [facility] endocrinology in [place])--  breast leak milk  . Hypertension   . Hypothyroidism   . IBS (irritable bowel syndrome)   . Insomnia   . Left flank pain   . Left ureteral stone   . Migraines   . Nephrolithiasis    bilateral non-obstructive per CT 01-24-2017  . Snores   . Tachycardia    consult cardiologist-- dr Edman Circle [facility] cardiology in [place])  01-26-2017 per pt schedule to wear holter moniter and get sleep study  . Urgency of urination   . Wears glasses     Past Surgical History:  Procedure Laterality Date  . BREAST BIOPSY Right 2009   benign  . CYSTO/  URETERSCOPIC STONE EXTRACTION /  STENT PLACEMENT  2007  . CYSTOSCOPY WITH RETROGRADE PYELOGRAM, URETEROSCOPY AND STENT PLACEMENT Left 02/01/2017   Procedure: CYSTOSCOPY WITH RETROGRADE PYELOGRAM, URETEROSCOPY AND STENT PLACEMENT;  Surgeon: Cleon Gustin, MD;  Location: [facility];  Service: Urology;  Laterality: Left;  .  ESOPHAGOGASTRODUODENOSCOPY N/A 11/16/2015   Procedure: ESOPHAGOGASTRODUODENOSCOPY (EGD);  Surgeon: Teena Irani, MD;  Location: [facility] ENDOSCOPY;  Service: Endoscopy;  Laterality: N/A;  . FOOT SURGERY Left 1998   nodule removed  . LAPAROSCOPIC CHOLECYSTECTOMY  02/06/2013  . LUMBAR LAMINECTOMY/DECOMPRESSION MICRODISCECTOMY Left 06/03/2013   Procedure: LEFT LUMBAR FIVE SACRAL ONE LAMINECTOMY/DECOMPRESSION MICRODISCECTOMY ;  Surgeon: Consuella Lose, MD;  Location: [place] NEURO ORS;  Service: Neurosurgery;  Laterality: Left;  LEFT L5S1 microdiskectomy  . POSTERIOR LUMBAR FUSION  10/06/2016   L5 -- S1    There were no vitals filed for this visit.      Subjective Assessment - 05/17/17 1101    Subjective muscle soreness after last session. forgot inhaler today ( pt verb sitting up more playing games)   Currently in Pain? Yes   Pain Score 6    Pain Location Back                         [facility] Adult PT Treatment/Exercise - 05/17/17 0001      Lumbar Exercises: Aerobic   Stationary Bike 6 min   Elliptical Nustep L 6 8 min   UBE (Upper Arm Bike) L 6 3 fwd/back     Lumbar Exercises: Machines for Strengthening   Cybex Lumbar Extension 35# stand ext and 25# upright row 15 each  Cybex Knee Extension 10lb 3x10   Cybex Knee Flexion 35lb 2x15     Lumbar Exercises: Standing   Other Standing Lumbar Exercises scap stab pulleys 10# each 3 way 15 each  obl 15 # 15 times each   Other Standing Lumbar Exercises 40# resisted gait  wt ball core work     Lumbar Exercises: Seated   Sit to Stand 10 reps  wt ball                  PT Short Term Goals - 02/15/17 1523      PT SHORT TERM GOAL #1   Title independent with initial HEP   Status Achieved           PT Long Term Goals - 05/17/17 1129      PT LONG TERM GOAL #1   Title walk 500 feet with a SPC with 1 rest   Status Achieved     PT LONG TERM GOAL #2   Title increase lumbar ROM 25%   Status Achieved     PT LONG  TERM GOAL #3   Title decrease pain 50%   Status Partially Met     PT LONG TERM GOAL #4   Title report able to stand 5 minutes   Baseline pt did 5 min today but then pain spiked ot 10/10 and requested seated rest   Status Partially Met               Plan - 05/17/17 1131    Clinical Impression Statement 20 min cardio today without rest but all seated. pt did tol 5 min standing ex without rest but with pain spike so goal not marked met. Cuing to activate core with ex.   PT Next Visit Plan gait.core and LE strength      Patient will benefit from skilled therapeutic intervention in order to improve the following deficits and impairments:  Cardiopulmonary status limiting activity, Decreased activity tolerance, Decreased mobility, Decreased range of motion, Decreased strength, Difficulty walking, Increased muscle spasms, Impaired flexibility, Postural dysfunction, Improper body mechanics, Pain  Visit Diagnosis: Acute bilateral low back pain with left-sided sciatica  Muscle spasm of back  Difficulty in walking, not elsewhere classified     Problem List Patient Active Problem List   Diagnosis Date Noted  . S/P lumbar fusion 10/06/2016  . Cholelithiasis with acute or chronic cholecystitis 02/04/2013    ,ANGIE PTA 05/17/2017, 11:36 AM  [facility] [address] [place], [place], [address] Phone: [phone]   Fax:  [phone]  Name: Darlene Frazier MRN: [id number] Date of Birth: 11/13/1980   

## 2017-05-25 ENCOUNTER — Ambulatory Visit: Payer: Medicare Other | Admitting: Physical Therapy

## 2017-05-25 DIAGNOSIS — M5442 Lumbago with sciatica, left side: Secondary | ICD-10-CM | POA: Diagnosis not present

## 2017-05-25 DIAGNOSIS — R262 Difficulty in walking, not elsewhere classified: Secondary | ICD-10-CM

## 2017-05-25 DIAGNOSIS — M62830 Muscle spasm of back: Secondary | ICD-10-CM

## 2017-05-25 NOTE — Therapy (Signed)
[place], [place], [address] Phone: [phone]   Fax:  [phone]  Physical Therapy Treatment  Patient Details  Name: DEMARA Frazier MRN: [id number] Date of Birth: 06/20/1981 Referring Provider: Kathyrn Sheriff  Encounter Date: 05/25/2017      PT End of Session - 05/25/17 0757    Visit Number 20   Date for PT Re-Evaluation 06/08/17   PT Start Time 0750   PT Stop Time 0835   PT Time Calculation (min) 45 min      Past Medical History:  Diagnosis Date  . Arthritis   . Bipolar 1 disorder ([place])   . Borderline personality disorder   . Depression   . Dyslipidemia   . Environmental and seasonal allergies   . Exercise-induced asthma    no inhaler  . Fibromyalgia   . Frequency of urination   . GERD (gastroesophageal reflux disease)   . Hiatal hernia   . History of chronic gastritis   . History of kidney stones   . Hyperprolactinemia ([place])    followed by dr doerr [facility] endocrinology in [place])--  breast leak milk  . Hypertension   . Hypothyroidism   . IBS (irritable bowel syndrome)   . Insomnia   . Left flank pain   . Left ureteral stone   . Migraines   . Nephrolithiasis    bilateral non-obstructive per CT 01-24-2017  . Snores   . Tachycardia    consult cardiologist-- dr Edman Circle [facility] cardiology in [place])  01-26-2017 per pt schedule to wear holter moniter and get sleep study  . Urgency of urination   . Wears glasses     Past Surgical History:  Procedure Laterality Date  . BREAST BIOPSY Right 2009   benign  . CYSTO/  URETERSCOPIC STONE EXTRACTION /  STENT PLACEMENT  2007  . CYSTOSCOPY WITH RETROGRADE PYELOGRAM, URETEROSCOPY AND STENT PLACEMENT Left 02/01/2017   Procedure: CYSTOSCOPY WITH RETROGRADE PYELOGRAM, URETEROSCOPY AND STENT PLACEMENT;  Surgeon: Cleon Gustin, MD;  [facility];  Service: Urology;  Laterality: Left;  .  ESOPHAGOGASTRODUODENOSCOPY N/A 11/16/2015   Procedure: ESOPHAGOGASTRODUODENOSCOPY (EGD);  Surgeon: Teena Irani, MD;  Location: [facility] ENDOSCOPY;  Service: Endoscopy;  Laterality: N/A;  . FOOT SURGERY Left 1998   nodule removed  . LAPAROSCOPIC CHOLECYSTECTOMY  02/06/2013  . LUMBAR LAMINECTOMY/DECOMPRESSION MICRODISCECTOMY Left 06/03/2013   Procedure: LEFT LUMBAR FIVE SACRAL ONE LAMINECTOMY/DECOMPRESSION MICRODISCECTOMY ;  Surgeon: Consuella Lose, MD;  Location: [place] NEUR[facility];  Service: Neurosurgery;  Laterality: Left;  LEFT L5S1 microdiskectomy  . POSTERIOR LUMBAR FUSION  10/06/2016   L5 -- S1    There were no vitals filed for this visit.      Subjective Assessment - 05/25/17 0755    Subjective "GP recommended me for wt loss surgery so have to be off all pain meds for 6 monthes. This is first week and in alot of pain, I have been on the couch all week doing nothing- I am exhausted"   Currently in Pain? Yes   Pain Score 8    Pain Location Back                         [facility] Adult PT Treatment/Exercise - 05/25/17 0001      Lumbar Exercises: Aerobic   Elliptical Nustep L 6 8 min   UBE (Upper Arm Bike) L 6 3 fwd/back     Lumbar  Exercises: Machines for Strengthening   Cybex Lumbar Extension black band ext 2 sets 15 seated   Cybex Knee Extension 10lb 3x10   Cybex Knee Flexion 35lb 2x15   Leg Press 30# with ball squeeze for pelvic floor 2x15   Other Lumbar Machine Exercise row & lat pull 25lb 2 sets 15     Lumbar Exercises: Seated   Other Seated Lumbar Exercises core activation ex on sit fit                  PT Short Term Goals - 02/15/17 1523      PT SHORT TERM GOAL #1   Title independent with initial HEP   Status Achieved           PT Long Term Goals - 05/25/17 0802      PT LONG TERM GOAL #3   Title decrease pain 50%   Status On-going     PT LONG TERM GOAL #4   Title report able to stand 5 minutes   Status Partially Met                Plan - 05/25/17 0824    Clinical Impression Statement pain about the same thru session and pt tolerated ex as normal- did stay away from standing ex which make things worse. encouraged core activation with all ex   PT Next Visit Plan gait.core and LE strength- pt to start wt loss diet and will encourage independant ex options after D/C      Patient will benefit from skilled therapeutic intervention in order to improve the following deficits and impairments:  Cardiopulmonary status limiting activity, Decreased activity tolerance, Decreased mobility, Decreased range of motion, Decreased strength, Difficulty walking, Increased muscle spasms, Impaired flexibility, Postural dysfunction, Improper body mechanics, Pain  Visit Diagnosis: Acute bilateral low back pain with left-sided sciatica  Muscle spasm of back  Difficulty in walking, not elsewhere classified     Problem List Patient Active Problem List   Diagnosis Date Noted  . S/P lumbar fusion 10/06/2016  . Cholelithiasis with acute or chronic cholecystitis 02/04/2013    Freddy Kinne,ANGIE PTA  05/25/2017, 8:28 AM  [place], [place], [address] Phone: [phone]   Fax:  [phone]  Name: Darlene Frazier MRN: [id number] Date of Birth: 05-07-1981

## 2017-05-29 ENCOUNTER — Ambulatory Visit: Payer: Medicare Other | Admitting: Physical Therapy

## 2017-05-31 ENCOUNTER — Ambulatory Visit: Payer: Medicare Other | Admitting: Physical Therapy

## 2017-05-31 ENCOUNTER — Encounter: Payer: Self-pay | Admitting: Physical Therapy

## 2017-05-31 DIAGNOSIS — M5442 Lumbago with sciatica, left side: Secondary | ICD-10-CM

## 2017-05-31 DIAGNOSIS — R262 Difficulty in walking, not elsewhere classified: Secondary | ICD-10-CM

## 2017-05-31 DIAGNOSIS — M62830 Muscle spasm of back: Secondary | ICD-10-CM

## 2017-05-31 NOTE — Therapy (Addendum)
[facility] [place] [place], [place], [address] Phone: [phone]   Fax:  [phone]  Physical Therapy Treatment  Patient Details  Name: Darlene Frazier MRN: [id number] Date of Birth: [birth year] Referring Provider: Kathyrn Sheriff  Encounter Date: 05/31/2017      PT End of Session - 05/31/17 1146    Visit Number 21   Date for PT Re-Evaluation 06/08/17   PT Start Time 1135   PT Stop Time 1230   PT Time Calculation (min) 55 min      Past Medical History:  Diagnosis Date  . Arthritis   . Bipolar 1 disorder ([place])   . Borderline personality disorder   . Depression   . Dyslipidemia   . Environmental and seasonal allergies   . Exercise-induced asthma    no inhaler  . Fibromyalgia   . Frequency of urination   . GERD (gastroesophageal reflux disease)   . Hiatal hernia   . History of chronic gastritis   . History of kidney stones   . Hyperprolactinemia ([place])    followed by dr doerr [facility] endocrinology in [place])--  breast leak milk  . Hypertension   . Hypothyroidism   . IBS (irritable bowel syndrome)   . Insomnia   . Left flank pain   . Left ureteral stone   . Migraines   . Nephrolithiasis    bilateral non-obstructive per CT 01-24-2017  . Snores   . Tachycardia    consult cardiologist-- dr [address] [facility] cardiology in [place])  01-26-2017 per pt schedule to wear holter moniter and get sleep study  . Urgency of urination   . Wears glasses     Past Surgical History:  Procedure Laterality Date  . BREAST BIOPSY Right 2009   benign  . CYSTO/  URETERSCOPIC STONE EXTRACTION /  STENT PLACEMENT  2007  . CYSTOSCOPY WITH RETROGRADE PYELOGRAM, URETEROSCOPY AND STENT PLACEMENT Left 02/01/2017   Procedure: CYSTOSCOPY WITH RETROGRADE PYELOGRAM, URETEROSCOPY AND STENT PLACEMENT;  Surgeon: Cleon Gustin, MD;  Location: [facility];  Service: Urology;  Laterality: Left;  .  ESOPHAGOGASTRODUODENOSCOPY N/A 11/16/2015   Procedure: ESOPHAGOGASTRODUODENOSCOPY (EGD);  Surgeon: Teena Irani, MD;  Location: [facility] ENDOSCOPY;  Service: Endoscopy;  Laterality: N/A;  . FOOT SURGERY Left 1998   nodule removed  . LAPAROSCOPIC CHOLECYSTECTOMY  02/06/2013  . LUMBAR LAMINECTOMY/DECOMPRESSION MICRODISCECTOMY Left 06/03/2013   Procedure: LEFT LUMBAR FIVE SACRAL ONE LAMINECTOMY/DECOMPRESSION MICRODISCECTOMY ;  Surgeon: Consuella Lose, MD;  Location: [facility];  Service: Neurosurgery;  Laterality: Left;  LEFT L5S1 microdiskectomy  . POSTERIOR LUMBAR FUSION  10/06/2016   L5 -- S1    There were no vitals filed for this visit.      Subjective Assessment - 05/31/17 1142    Subjective slept thru last appt. saw MD and she rec gym membership   Currently in Pain? Yes   Pain Score 7    Pain Location Back                         [facility] Adult PT Treatment/Exercise - 05/31/17 0001      Lumbar Exercises: Aerobic   Elliptical Nustep L 6 8 min   Tread Mill 1.7 mph  .25 mile  8 min 42 sec   UBE (Upper Arm Bike) L 6 3 fwd/back     Lumbar Exercises: Machines for Strengthening   Cybex Lumbar Extension black band ext 2 sets 15  seated   Cybex Knee Extension 10lb 3x10   Cybex Knee Flexion 35lb 2x15   Other Lumbar Machine Exercise row & lat pull 25lb 2 sets 15     Lumbar Exercises: Standing   Other Standing Lumbar Exercises row 35# 15, upright row 35# 15 times                PT Education - 05/31/17 1146    Education provided Yes   Education Details discussed varies gym programs and options in area   Person(s) Educated Patient   Methods Explanation   Comprehension Verbalized understanding          PT Short Term Goals - 02/15/17 1523      PT SHORT TERM GOAL #1   Title independent with initial HEP   Status Achieved           PT Long Term Goals - 05/31/17 1144      PT LONG TERM GOAL #1   Title walk 500 feet with a SPC with 1 rest   Status  Achieved     PT LONG TERM GOAL #2   Title increase lumbar ROM 25%   Status Achieved     PT LONG TERM GOAL #3   Title decrease pain 50%   Baseline was progressing until off pain meds pending weight loss surgery     PT LONG TERM GOAL #4   Title report able to stand 5 minutes   Status Partially Met               Plan - 05/31/17 1148    Clinical Impression Statement pt was progressing with goals but  set back with coming off pain meds. tolerates session well with ther ex. discussed gym options with pt and asked her to check into as preparation for D/C next week   PT Treatment/Interventions ADLs/Self Care Home Management;Electrical Stimulation;Cryotherapy;Moist Heat;Therapeutic activities;Therapeutic exercise;Neuromuscular re-education;Patient/family education;Manual techniques   PT Next Visit Plan D/C next week - transition to gym program      Patient will benefit from skilled therapeutic intervention in order to improve the following deficits and impairments:  Cardiopulmonary status limiting activity, Decreased activity tolerance, Decreased mobility, Decreased range of motion, Decreased strength, Difficulty walking, Increased muscle spasms, Impaired flexibility, Postural dysfunction, Improper body mechanics, Pain  Visit Diagnosis: Acute bilateral low back pain with left-sided sciatica  Muscle spasm of back  Difficulty in walking, not elsewhere classified     Problem List Patient Active Problem List   Diagnosis Date Noted  . S/P lumbar fusion 10/06/2016  . Cholelithiasis with acute or chronic cholecystitis 02/04/2013   PHYSICAL THERAPY DISCHARGE SUMMARY   Plan: Patient agrees to discharge.  Patient goals were partially met. Patient is being discharged due to not returning since the last visit.  ?????      Fonda Rochon,ANGIE PTA 05/31/2017, 12:16 PM  [address] [place] [address] [place], [place],  [address] Phone: [phone]   Fax:  [phone]  Name: Darlene Frazier MRN: [id number] Date of Birth: 26-May-1981

## 2017-06-06 ENCOUNTER — Ambulatory Visit: Payer: Medicare Other | Admitting: Physical Therapy

## 2017-06-08 ENCOUNTER — Ambulatory Visit: Payer: Medicare Other | Admitting: Physical Therapy

## 2017-11-13 IMAGING — RF DG C-ARM 61-120 MIN
1 series · 2 of 2 positions shown · non-contrast
Comparison: None.

CLINICAL DATA: Posterior fusion L5-S1

EXAM:
LUMBAR SPINE - 2-3 VIEW; DG C-ARM 61-120 MIN

[Series 1: run · 2 of 2 slices shown]
[im 1/2]
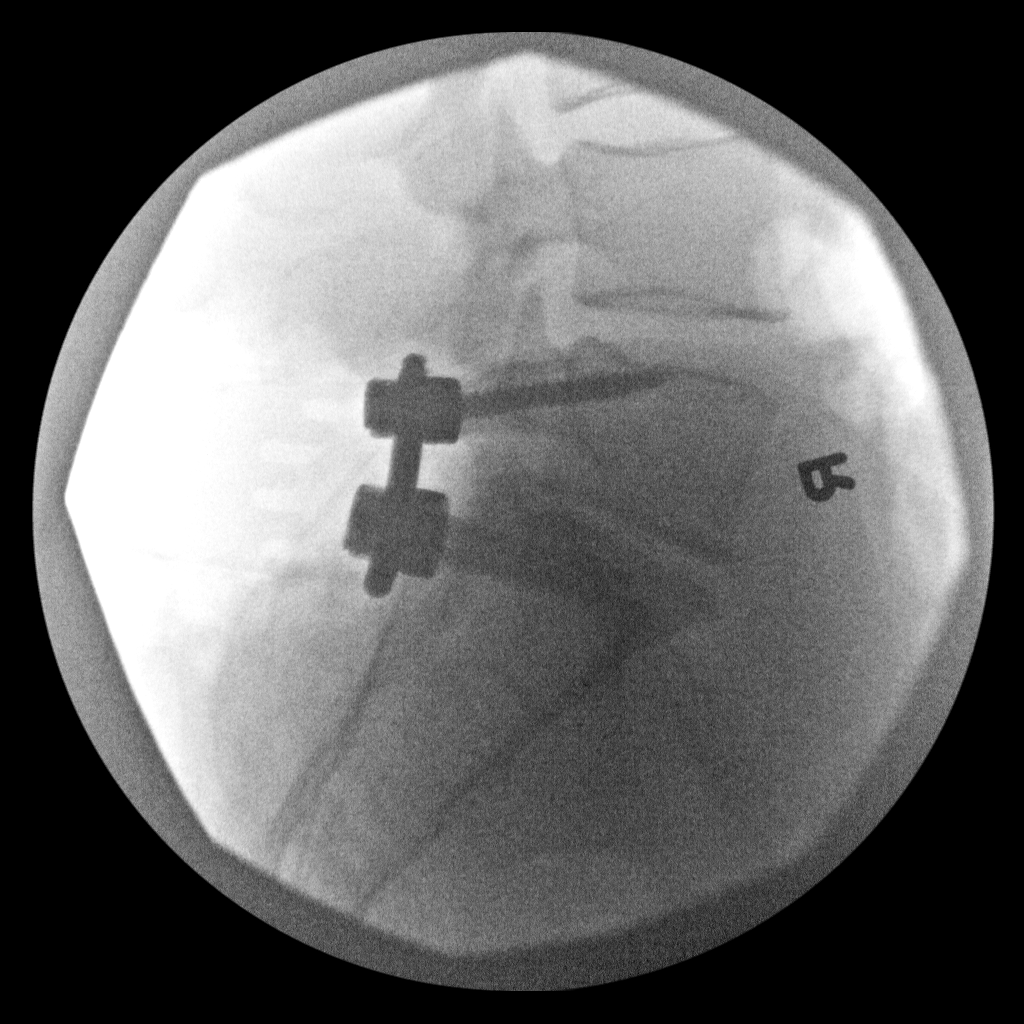
[im 2/2]
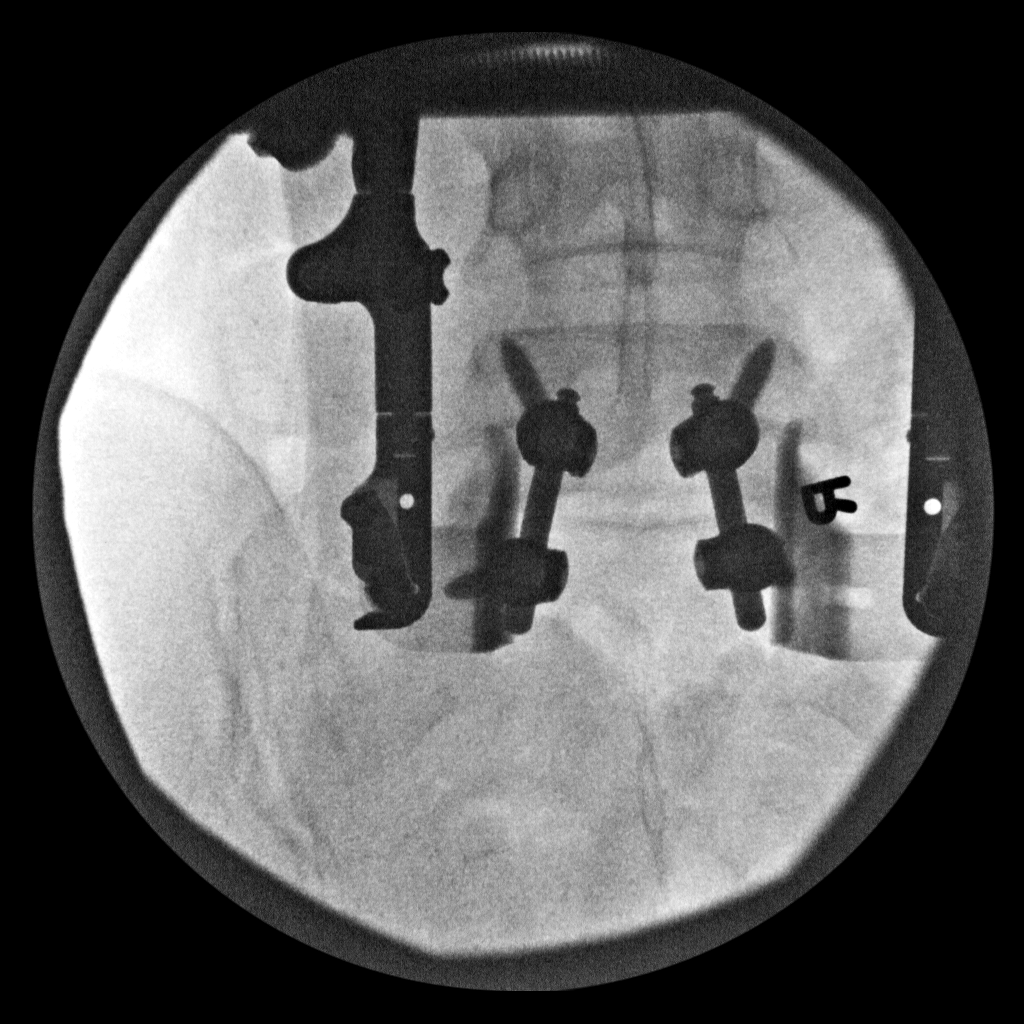

[2 of 2 positions shown; findings below may reference images not displayed]

FINDINGS: Two intraoperative spot images demonstrate changes of posterior
fusion at L5-S1. No hardware or bony complicating feature. Normal
alignment.
IMPRESSION: Posterior fusion changes L5-S1.

## 2018-03-03 IMAGING — CT CT RENAL STONE PROTOCOL
2 of 3 series · 16 of 46 positions shown, 18 images · non-contrast
Comparison: CT of the abdomen and pelvis performed 11/13/2015

CLINICAL DATA: Chronic increased urinary urgency. Left flank pain
and suprapubic pain. Initial encounter.

EXAM:
CT ABDOMEN AND PELVIS WITHOUT CONTRAST
TECHNIQUE: Multidetector CT imaging of the abdomen and pelvis was performed
following the standard protocol without IV contrast.

[Series 4: lung · axial · 0.79mm/px · z∈[-144,-36]mm · 13 of 62 slices shown, 15 images]
[im 4/62  soft-tissue]
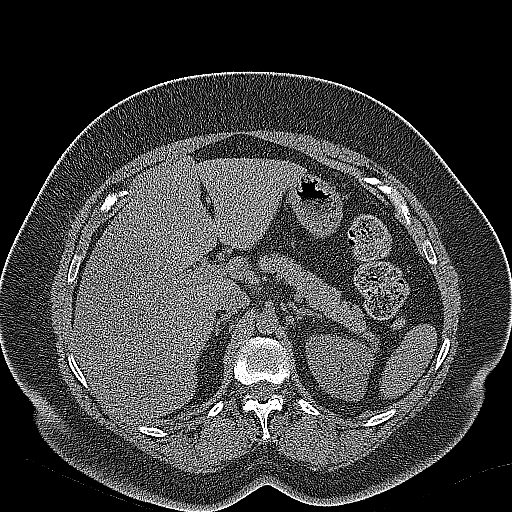
[im 4/62  bone]
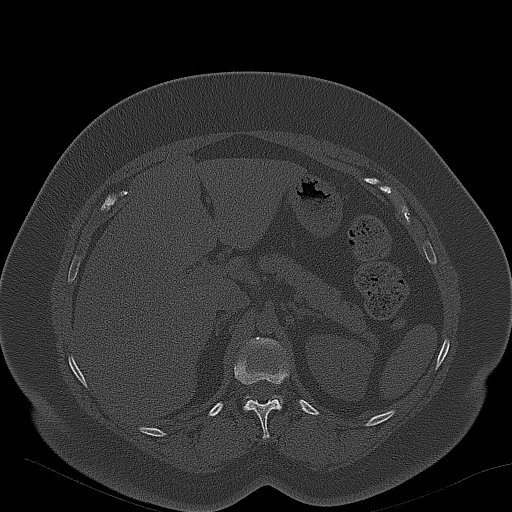
[im 8/62  soft-tissue]
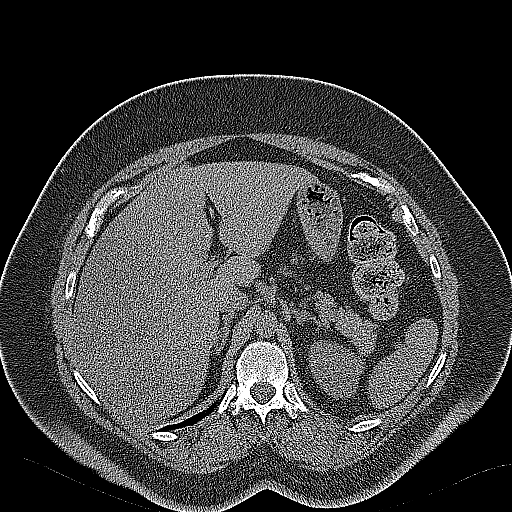
[im 12/62  soft-tissue]
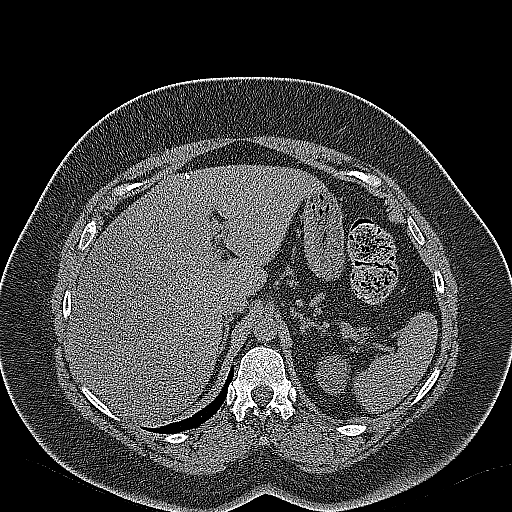
[im 18/62  soft-tissue]
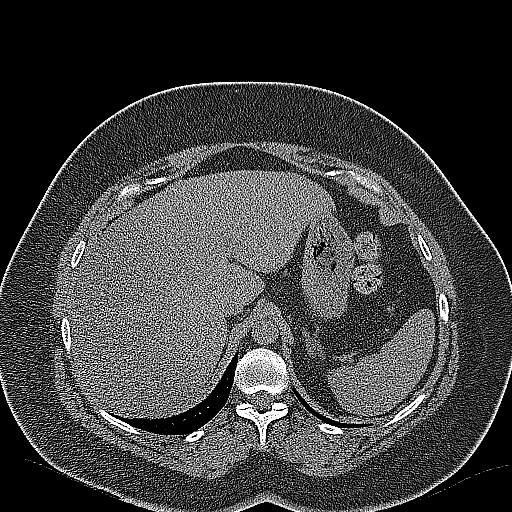
[im 22/62  soft-tissue]
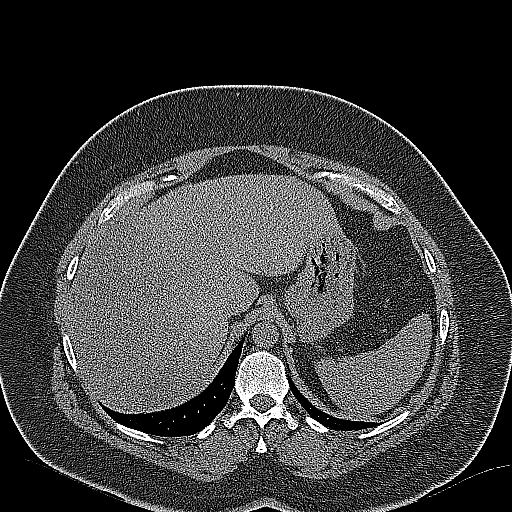
[im 26/62  soft-tissue]
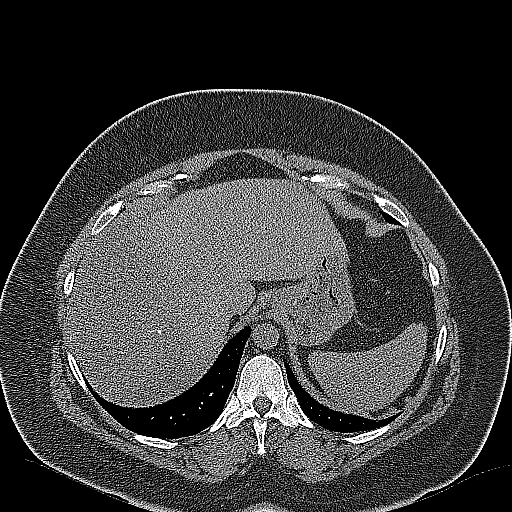
[im 32/62  soft-tissue]
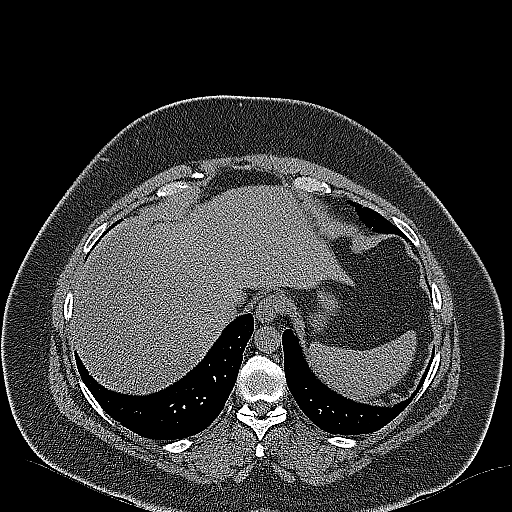
[im 36/62  soft-tissue]
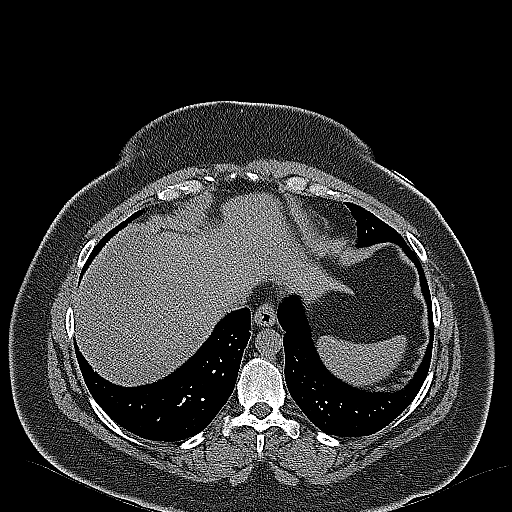
[im 40/62  soft-tissue]
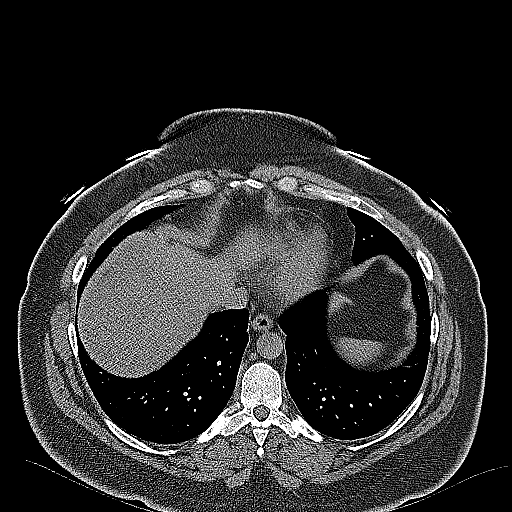
[im 40/62  bone]
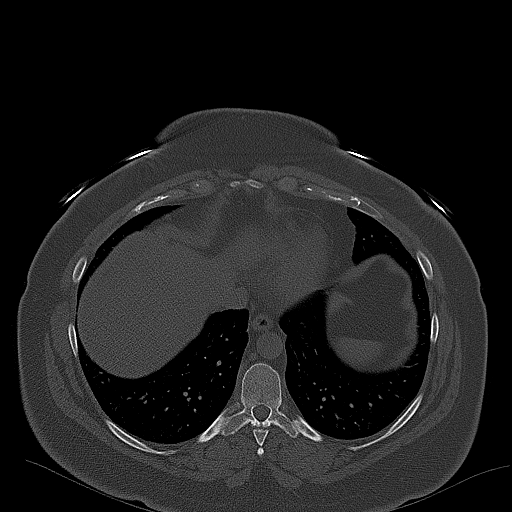
[im 44/62  soft-tissue]
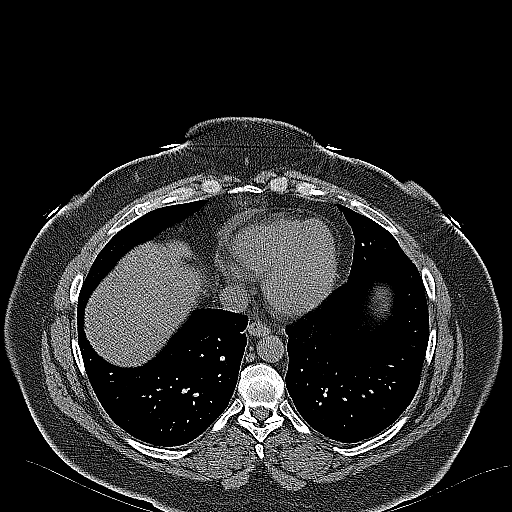
[im 50/62  soft-tissue]
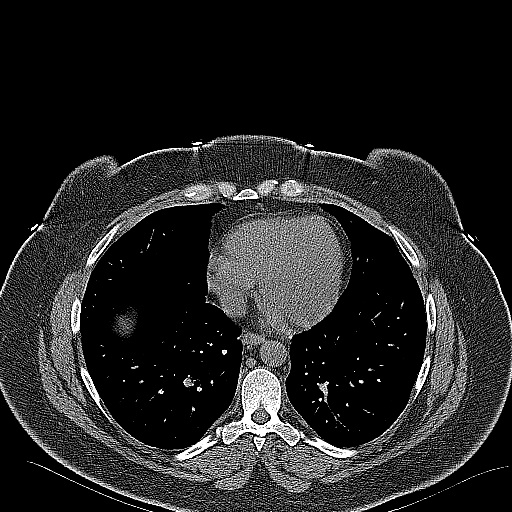
[im 54/62  soft-tissue]
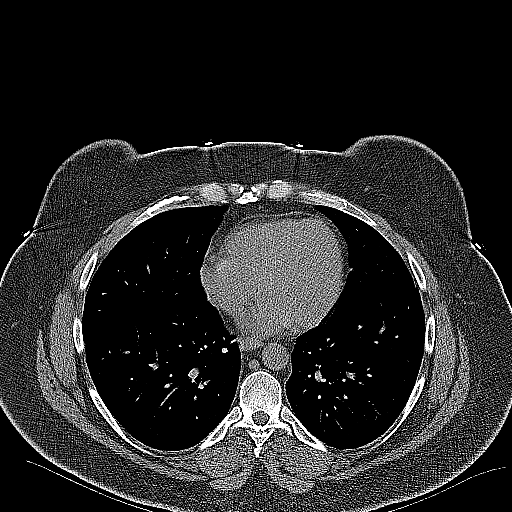
[im 58/62  soft-tissue]
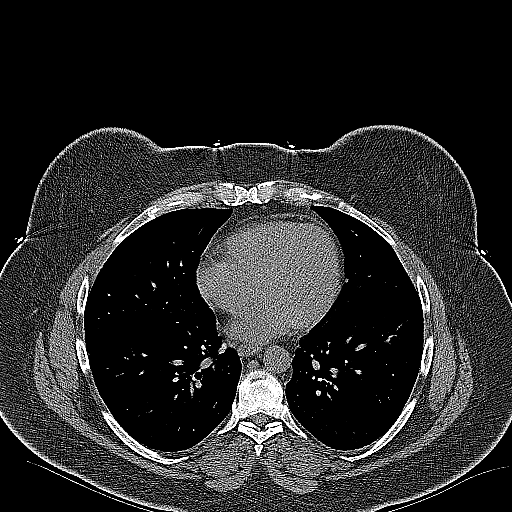

[Series 5: coronal · coronal · 0.76mm/px · 3 of 171 slices shown]
[im 57/171  soft-tissue]
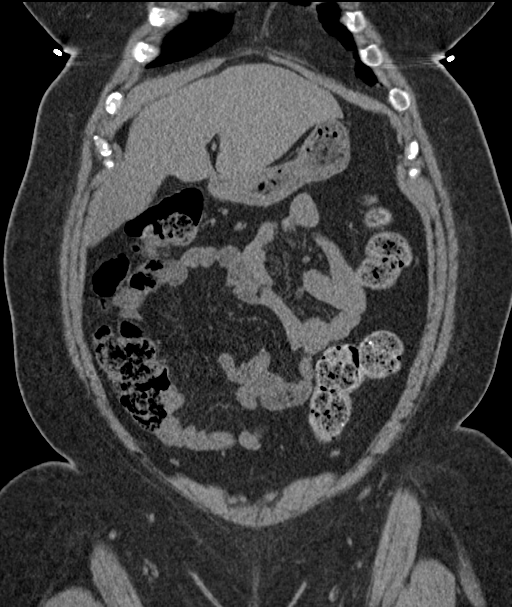
[im 76/171  soft-tissue]
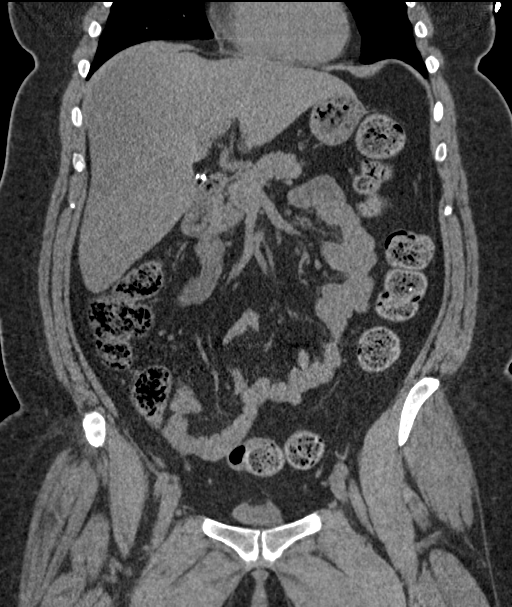
[im 95/171  soft-tissue]
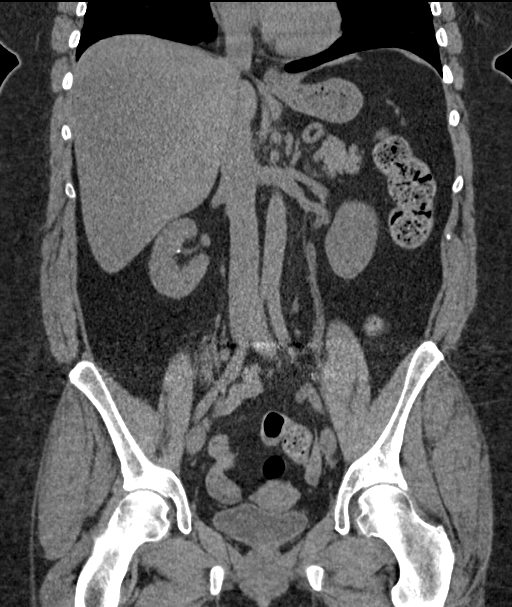

[16 of 46 positions shown; findings below may reference images not displayed]

FINDINGS: Lower chest: The visualized lung bases are grossly clear. The
visualized portions of the mediastinum are unremarkable.

Hepatobiliary: Calcified granulomata are noted within the right
hepatic lobe. There is diffuse fatty infiltration within the liver.
The patient is status post cholecystectomy, with clips noted at the
gallbladder fossa. The common bile duct remains normal in caliber.

Pancreas: The pancreas is within normal limits.

Spleen: The spleen is unremarkable in appearance.

Adrenals/Urinary Tract: The adrenal glands are unremarkable in
appearance.

There is minimal left-sided hydronephrosis, with prominence of the
left ureter along its entire course. An obstructing 4 x 3 mm stone
is noted at the distal left ureter, just above the left
vesicoureteral junction.

Nonobstructing bilateral renal stones measure up to 5 mm in size. No
perinephric stranding is seen.

Stomach/Bowel: The stomach is unremarkable in appearance. The small
bowel is within normal limits. The appendix is normal in caliber,
without evidence of appendicitis. The colon is unremarkable in
appearance.

Vascular/Lymphatic: The abdominal aorta is unremarkable in
appearance. The inferior vena cava is grossly unremarkable. No
retroperitoneal lymphadenopathy is seen. No pelvic sidewall
lymphadenopathy is identified.

Reproductive: The bladder is mildly distended and otherwise within
normal limits. The uterus is grossly unremarkable in appearance. The
ovaries are relatively symmetric. No suspicious adnexal masses are
seen.

Other: No additional soft tissue abnormalities are seen.

Musculoskeletal: No acute osseous abnormalities are identified. The
patient is status post lumbosacral spinal fusion at L5-S1. The
visualized musculature is unremarkable in appearance.
IMPRESSION: 1. Minimal left-sided hydronephrosis, with obstructing 4 x 3 mm
stone at the distal left ureter, just above the left vesicoureteral
junction.
2. Nonobstructing bilateral renal stones measure up to 5 mm in size.
3. Diffuse fatty infiltration within the liver.

## 2024-06-30 ENCOUNTER — Other Ambulatory Visit: Payer: Self-pay | Admitting: Medical Genetics

## 2024-07-29 ENCOUNTER — Other Ambulatory Visit

## 2024-10-13 ENCOUNTER — Other Ambulatory Visit: Payer: Self-pay | Admitting: Medical Genetics

## 2024-10-13 DIAGNOSIS — Z006 Encounter for examination for normal comparison and control in clinical research program: Secondary | ICD-10-CM

## 2024-11-05 ENCOUNTER — Encounter (INDEPENDENT_AMBULATORY_CARE_PROVIDER_SITE_OTHER): Payer: Self-pay
